# Patient Record
Sex: Female | Born: 2000 | Race: Black or African American | Hispanic: No | Marital: Single | Smoking: Never smoker
Health system: Southern US, Community
[De-identification: ages and names within clinical notes are randomized; demographics above are authoritative.]

## PROBLEM LIST (undated history)

## (undated) DIAGNOSIS — F419 Anxiety disorder, unspecified: Secondary | ICD-10-CM

## (undated) DIAGNOSIS — D649 Anemia, unspecified: Secondary | ICD-10-CM

## (undated) DIAGNOSIS — N200 Calculus of kidney: Secondary | ICD-10-CM

## (undated) DIAGNOSIS — R1115 Cyclical vomiting syndrome unrelated to migraine: Secondary | ICD-10-CM

## (undated) DIAGNOSIS — N83209 Unspecified ovarian cyst, unspecified side: Secondary | ICD-10-CM

## (undated) DIAGNOSIS — K219 Gastro-esophageal reflux disease without esophagitis: Secondary | ICD-10-CM

## (undated) DIAGNOSIS — F32A Depression, unspecified: Secondary | ICD-10-CM

## (undated) HISTORY — PX: OTHER SURGICAL HISTORY: SHX169

## (undated) HISTORY — DX: Cyclical vomiting syndrome unrelated to migraine: R11.15

## (undated) HISTORY — PX: TONSILLECTOMY: SUR1361

---
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
## 2021-07-05 ENCOUNTER — Emergency Department (HOSPITAL_COMMUNITY)
Admission: EM | Admit: 2021-07-05 | Discharge: 2021-07-05 | Disposition: A | Discharge status: Home / Self Care | Payer: Medicaid Other | Attending: Student | Admitting: Student

## 2021-07-05 ENCOUNTER — Other Ambulatory Visit: Payer: Self-pay

## 2021-07-05 DIAGNOSIS — Z5321 Procedure and treatment not carried out due to patient leaving prior to being seen by health care provider: Secondary | ICD-10-CM | POA: Diagnosis not present

## 2021-07-05 DIAGNOSIS — J029 Acute pharyngitis, unspecified: Secondary | ICD-10-CM | POA: Insufficient documentation

## 2021-07-05 DIAGNOSIS — J3489 Other specified disorders of nose and nasal sinuses: Secondary | ICD-10-CM | POA: Insufficient documentation

## 2021-07-05 NOTE — ED Triage Notes (Signed)
Pt here d/t sore throat and head congestion X9 days. Airway intact. Denies SOB, N,V

## 2021-09-28 ENCOUNTER — Ambulatory Visit (HOSPITAL_COMMUNITY)
Admission: EM | Admit: 2021-09-28 | Discharge: 2021-09-28 | Disposition: A | Discharge status: Home / Self Care | Payer: Medicaid Other | Attending: Family Medicine | Admitting: Family Medicine

## 2021-09-28 ENCOUNTER — Encounter (HOSPITAL_COMMUNITY): Payer: Self-pay

## 2021-09-28 ENCOUNTER — Other Ambulatory Visit: Payer: Self-pay

## 2021-09-28 DIAGNOSIS — J029 Acute pharyngitis, unspecified: Secondary | ICD-10-CM | POA: Diagnosis present

## 2021-09-28 LAB — RESPIRATORY PANEL BY PCR

## 2021-09-28 MED ORDER — LIDOCAINE VISCOUS HCL 2 % MT SOLN: 5.0000 mL | OROMUCOSAL | 0 refills | Status: AC | PRN

## 2021-09-28 NOTE — ED Triage Notes (Signed)
Pt c/o sore throat since Saturday. States had a covid exposure on Friday. States had a tonsillectomy 3wks ago.

## 2021-09-28 NOTE — ED Provider Notes (Signed)
[facility]    CSN: [id number] Arrival date & time: 09/28/21  1706      History   Chief Complaint Chief Complaint  Patient presents with   Sore Throat    HPI Linda Cross is a 20 y.o. female.   Presenting today with 4-day history of sore throat, congestion, mild cough.  Denies fever, chills, difficulty swallowing or breathing, abdominal pain, nausea vomiting or diarrhea.  So far has not tried anything over-the-counter for symptoms.  Multiple sick contacts at school with COVID per patient.  She is requesting respiratory testing.  She states she has status post tonsillectomy 3 weeks ago.  Has had an uncomplicated healing course thus far.   Past Medical History:  Diagnosis Date   Cyclical vomiting     Patient Active Problem List   Diagnosis Date Noted   Infectious colitis 03/05/2014   Diarrhea 02/16/2014   Dehydration 02/16/2014   Hx of Salmonella infection 02/16/2014   Cyclical vomiting     History reviewed. No pertinent surgical history.  OB History     Gravida  0   Para  0   Term  0   Preterm  0   AB  0   Living  0      SAB  0   IAB  0   Ectopic  0   Multiple  0   Live Births               Home Medications    Prior to Admission medications   Medication Sig Start Date End Date Taking? Authorizing Provider  lidocaine (XYLOCAINE) 2 % solution Use as directed 5 mLs in the mouth or throat as needed for mouth pain. 09/28/21  Yes Particia Nearing, PA-C  Multiple Vitamin (MULTIVITAMIN) tablet Take 1 tablet by mouth daily.    [provider]    Family History Family History  Problem Relation Age of Onset   Migraines Mother    Migraines Father    Crohn's disease Cousin     Social History Social History   Tobacco Use   Smoking status: Never   Smokeless tobacco: Never  Substance Use Topics   Alcohol use: No    Alcohol/week: 0.0 standard drinks   Drug use: No     Allergies   Patient has no known  allergies.   Review of Systems Review of Systems Per HPI  Physical Exam Triage Vital Signs ED Triage Vitals  Enc Vitals Group     BP 09/28/21 1825 133/64     Pulse Rate 09/28/21 1825 79     Resp 09/28/21 1825 18     Temp 09/28/21 1825 98.7 F (37.1 C)     Temp Source 09/28/21 1825 Oral     SpO2 09/28/21 1825 99 %     Weight --      Height --      Head Circumference --      Peak Flow --      Pain Score 09/28/21 1826 1     Pain Loc --      Pain Edu? --      Excl. in GC? --    No data found.  Updated Vital Signs BP 133/64 (BP Location: Left Arm)   Pulse 79   Temp 98.7 F (37.1 C) (Oral)   Resp 18   LMP 09/22/2021   SpO2 99%   Visual Acuity Right Eye Distance:   Left Eye Distance:   Bilateral Distance:  Right Eye Near:   Left Eye Near:    Bilateral Near:     Physical Exam Vitals and nursing note reviewed.  Constitutional:      Appearance: Normal appearance. She is not ill-appearing.  HENT:     Head: Atraumatic.     Nose: Rhinorrhea present.     Mouth/Throat:     Mouth: Mucous membranes are moist.     Pharynx: Oropharynx is clear. Posterior oropharyngeal erythema present. No oropharyngeal exudate.  Eyes:     Extraocular Movements: Extraocular movements intact.     Conjunctiva/sclera: Conjunctivae normal.  Cardiovascular:     Rate and Rhythm: Normal rate and regular rhythm.     Heart sounds: Normal heart sounds.  Pulmonary:     Effort: Pulmonary effort is normal.     Breath sounds: Normal breath sounds. No wheezing or rales.  Musculoskeletal:        General: Normal range of motion.     Cervical back: Normal range of motion and neck supple.  Skin:    General: Skin is warm and dry.     Findings: No erythema or rash.  Neurological:     Mental Status: She is alert and oriented to person, place, and time.     Motor: No weakness.     Gait: Gait normal.  Psychiatric:        Mood and Affect: Mood normal.        Thought Content: Thought content normal.         Judgment: Judgment normal.   UC Treatments / Results  Labs (all labs ordered are listed, but only abnormal results are displayed) Labs Reviewed  RESPIRATORY PANEL BY PCR    EKG   Radiology No results found.  Procedures Procedures (including critical care time)  Medications Ordered in UC Medications - No data to display  Initial Impression / Assessment and Plan / UC Course  I have reviewed the triage vital signs and the nursing notes.  Pertinent labs & imaging results that were available during my care of the patient were reviewed by me and considered in my medical decision making (see chart for details).     Vitals and exam reassuring, no evidence of acute distress.  COVID and respiratory panel pending, discussed lidocaine sent for symptomatic benefit.  Discussed over-the-counter decongestants, fever and pain reducers, quarantine protocol.  School note given.  Return for worsening symptoms.  Final Clinical Impressions(s) / UC Diagnoses   Final diagnoses:  Viral pharyngitis   Discharge Instructions   None    ED Prescriptions     Medication Sig Dispense Auth. Provider   lidocaine (XYLOCAINE) 2 % solution Use as directed 5 mLs in the mouth or throat as needed for mouth pain. 50 mL Volney American, [place]      PDMP not reviewed this encounter.   Volney American, [place] 09/28/21 1859

## 2021-09-28 NOTE — ED Notes (Signed)
No answer from lobby  

## 2022-03-27 ENCOUNTER — Other Ambulatory Visit: Payer: Self-pay

## 2022-03-27 ENCOUNTER — Encounter (HOSPITAL_BASED_OUTPATIENT_CLINIC_OR_DEPARTMENT_OTHER): Payer: Self-pay | Admitting: Emergency Medicine

## 2022-03-27 ENCOUNTER — Emergency Department (HOSPITAL_BASED_OUTPATIENT_CLINIC_OR_DEPARTMENT_OTHER)
Admission: EM | Admit: 2022-03-27 | Discharge: 2022-03-27 | Disposition: A | Discharge status: Home / Self Care | Payer: Medicaid Other | Attending: Emergency Medicine | Admitting: Emergency Medicine

## 2022-03-27 DIAGNOSIS — R0981 Nasal congestion: Secondary | ICD-10-CM | POA: Diagnosis not present

## 2022-03-27 DIAGNOSIS — J029 Acute pharyngitis, unspecified: Secondary | ICD-10-CM | POA: Diagnosis present

## 2022-03-27 DIAGNOSIS — H9209 Otalgia, unspecified ear: Secondary | ICD-10-CM | POA: Diagnosis not present

## 2022-03-27 DIAGNOSIS — J040 Acute laryngitis: Secondary | ICD-10-CM | POA: Insufficient documentation

## 2022-03-27 LAB — GROUP A STREP BY PCR: Group A Strep by PCR: NOT DETECTED

## 2022-03-27 NOTE — Discharge Instructions (Signed)
Please read and follow all provided instructions. ? ?Your diagnoses today include:  ?1. Laryngitis   ?2. Sore throat   ? ? ?Tests performed today include: ?Strep test: was negative for strep throat ?Vital signs. See below for your results today.  ? ?Medications prescribed:  ?None ? ?Home care instructions:  ?Please read the educational materials provided and follow any instructions contained in this packet. ? ?Follow-up instructions: ?Please follow-up with your primary care provider as needed for further evaluation of your symptoms. ? ?Return instructions:  ?Please return to the Emergency Department if you experience worsening symptoms.  ?Return if you have worsening problems swallowing, your neck becomes swollen, you cannot swallow your saliva or your voice becomes muffled.  ?Return with high persistent fever, persistent vomiting, or if you have trouble breathing.  ?Please return if you have any other emergent concerns. ? ?Additional Information: ? ?Your vital signs today were: ?BP (!) 130/92 (BP Location: Left Arm)   Pulse (!) 106   Temp 98.2 ?F (36.8 ?C) (Oral)   Resp (!) 22   Ht 5\' 5"  (1.651 m)   Wt 51.7 kg   LMP 03/11/2022   SpO2 100%   BMI 18.97 kg/m?  ?If your blood pressure (BP) was elevated above 135/85 this visit, please have this repeated by your doctor within one month. ?-------------- ? ?

## 2022-03-27 NOTE — ED Triage Notes (Signed)
Pt c/o sore throat onset Wednesday. ?

## 2022-03-27 NOTE — ED Provider Notes (Signed)
[facility] EMERGENCY DEPARTMENT Provider Note   CSN: [id number] Arrival date & time: 03/27/22  1214     History  Chief Complaint  Patient presents with   Sore Throat    Linda Cross is a 21 y.o. female.  Patient presents to the emergency department for evaluation of sore throat, hoarse voice, congestion, ear pain.  Symptoms started 2 to 3 days ago.  She works in a daycare.  No distinct sick contacts.  No fevers.  She is able to swallow without difficulty and no difficulty breathing.  No shortness of breath or wheezing.  She reports seasonal allergies.  No significant cough.  No vomiting or diarrhea.  She has taken over-the-counter sinus medication without much improvement.      Home Medications Prior to Admission medications   Medication Sig Start Date End Date Taking? Authorizing Provider  lidocaine (XYLOCAINE) 2 % solution Use as directed 5 mLs in the mouth or throat as needed for mouth pain. 09/28/21   Volney American, PA-C  Multiple Vitamin (MULTIVITAMIN) tablet Take 1 tablet by mouth daily.    [provider]      Allergies    Patient has no known allergies.    Review of Systems   Review of Systems  Physical Exam Updated Vital Signs BP (!) 130/92 (BP Location: Left Arm)   Pulse (!) 106   Temp 98.2 F (36.8 C) (Oral)   Resp (!) 22   Ht 5\' 5"  (1.651 m)   Wt 51.7 kg   LMP 03/11/2022   SpO2 100%   BMI 18.97 kg/m   Physical Exam Vitals and nursing note reviewed.  Constitutional:      Appearance: She is well-developed.  HENT:     Head: Normocephalic and atraumatic.     Jaw: No trismus.     Comments: Patient with scratchy voice    Right Ear: Tympanic membrane, ear canal and external ear normal.     Left Ear: Tympanic membrane, ear canal and external ear normal.     Nose: Nose normal. No mucosal edema or rhinorrhea.     Mouth/Throat:     Mouth: Mucous membranes are moist. Mucous membranes are not dry. No oral lesions.     Pharynx:  Uvula midline. No oropharyngeal exudate, posterior oropharyngeal erythema or uvula swelling.     Tonsils: No tonsillar abscesses.  Eyes:     General:        Right eye: No discharge.        Left eye: No discharge.     Conjunctiva/sclera: Conjunctivae normal.  Cardiovascular:     Rate and Rhythm: Normal rate and regular rhythm.     Heart sounds: Normal heart sounds.  Pulmonary:     Effort: Pulmonary effort is normal. No respiratory distress.     Breath sounds: Normal breath sounds. No wheezing or rales.  Abdominal:     Palpations: Abdomen is soft.     Tenderness: There is no abdominal tenderness.  Musculoskeletal:     Cervical back: Normal range of motion and neck supple.  Lymphadenopathy:     Cervical: No cervical adenopathy.  Skin:    General: Skin is warm and dry.  Neurological:     Mental Status: She is alert.  Psychiatric:        Mood and Affect: Mood normal.    ED Results / Procedures / Treatments   Labs (all labs ordered are listed, but only abnormal results are displayed) Labs Reviewed  GROUP  A STREP BY PCR    EKG None  Radiology No results found.  Procedures Procedures    Medications Ordered in ED Medications - No data to display  ED Course/ Medical Decision Making/ A&P    Patient seen and examined. History obtained directly from patient. Work-up including labs, imaging, EKG ordered in triage, if performed, were reviewed.    Labs/EKG: Independently reviewed and interpreted.  This included: Negative strep testing  Medications/Fluids: None ordered  Most recent vital signs reviewed and are as follows: BP (!) 130/92 (BP Location: Left Arm)   Pulse (!) 106   Temp 98.2 F (36.8 C) (Oral)   Resp (!) 22   Ht 5\' 5"  (1.651 m)   Wt 51.7 kg   LMP 03/11/2022   SpO2 100%   BMI 18.97 kg/m   Initial impression: Viral URI, laryngitis  Home treatment plan: Rest, hydration, OTC meds  Return instructions discussed with patient: Worsening difficulty  breathing or swallowing, fevers or other concerns  Follow-up instructions discussed with patient: Follow-up with PCP for recheck in the next 72 hours if not improving.                           Medical Decision Making  Patient with symptoms consistent with a viral syndrome, laryngitis/pharyngitis.  Considered strep pharyngitis, test negative.  Vitals are stable, no fever. No signs of dehydration. Lung exam normal, no signs of pneumonia. Supportive therapy indicated with return if symptoms worsen.           Final Clinical Impression(s) / ED Diagnoses Final diagnoses:  Laryngitis  Sore throat    Rx / DC Orders ED Discharge Orders     None         Carlisle Cater, PA-C 03/27/22 1500    Luna Fuse, MD 04/15/22 2156

## 2022-09-07 LAB — HEPATITIS C ANTIBODY: HCV Ab: NEGATIVE

## 2022-09-07 LAB — OB RESULTS CONSOLE HEPATITIS B SURFACE ANTIGEN: Hepatitis B Surface Ag: NEGATIVE

## 2022-09-28 ENCOUNTER — Inpatient Hospital Stay (HOSPITAL_COMMUNITY): Payer: Medicaid Other

## 2022-09-28 ENCOUNTER — Encounter (HOSPITAL_COMMUNITY): Payer: Self-pay | Admitting: *Deleted

## 2022-09-28 ENCOUNTER — Inpatient Hospital Stay (HOSPITAL_COMMUNITY)
Admission: AD | Admit: 2022-09-28 | Discharge: 2022-09-28 | Disposition: A | Discharge status: Home / Self Care | Payer: Medicaid Other | Attending: Obstetrics and Gynecology | Admitting: Obstetrics and Gynecology

## 2022-09-28 DIAGNOSIS — O99891 Other specified diseases and conditions complicating pregnancy: Secondary | ICD-10-CM | POA: Insufficient documentation

## 2022-09-28 DIAGNOSIS — M546 Pain in thoracic spine: Secondary | ICD-10-CM | POA: Diagnosis not present

## 2022-09-28 DIAGNOSIS — K219 Gastro-esophageal reflux disease without esophagitis: Secondary | ICD-10-CM | POA: Diagnosis not present

## 2022-09-28 DIAGNOSIS — O26891 Other specified pregnancy related conditions, first trimester: Secondary | ICD-10-CM | POA: Insufficient documentation

## 2022-09-28 DIAGNOSIS — K5900 Constipation, unspecified: Secondary | ICD-10-CM | POA: Diagnosis not present

## 2022-09-28 DIAGNOSIS — R102 Pelvic and perineal pain: Secondary | ICD-10-CM | POA: Insufficient documentation

## 2022-09-28 DIAGNOSIS — R109 Unspecified abdominal pain: Secondary | ICD-10-CM | POA: Diagnosis not present

## 2022-09-28 DIAGNOSIS — Z3A01 Less than 8 weeks gestation of pregnancy: Secondary | ICD-10-CM | POA: Diagnosis not present

## 2022-09-28 DIAGNOSIS — Z79899 Other long term (current) drug therapy: Secondary | ICD-10-CM | POA: Diagnosis not present

## 2022-09-28 DIAGNOSIS — Z87442 Personal history of urinary calculi: Secondary | ICD-10-CM | POA: Insufficient documentation

## 2022-09-28 DIAGNOSIS — O99619 Diseases of the digestive system complicating pregnancy, unspecified trimester: Secondary | ICD-10-CM | POA: Insufficient documentation

## 2022-09-28 HISTORY — DX: Calculus of kidney: N20.0

## 2022-09-28 HISTORY — DX: Depression, unspecified: F32.A

## 2022-09-28 HISTORY — DX: Unspecified ovarian cyst, unspecified side: N83.209

## 2022-09-28 HISTORY — DX: Gastro-esophageal reflux disease without esophagitis: K21.9

## 2022-09-28 HISTORY — DX: Anxiety disorder, unspecified: F41.9

## 2022-09-28 HISTORY — DX: Anemia, unspecified: D64.9

## 2022-09-28 LAB — CBC WITH DIFFERENTIAL/PLATELET
Abs Immature Granulocytes: 0 10*3/uL (ref 0.00–0.07)
Basophils Absolute: 0 10*3/uL (ref 0.0–0.1)
Basophils Relative: 1 %
Eosinophils Absolute: 0 10*3/uL (ref 0.0–0.5)
Eosinophils Relative: 1 %
HCT: 40.2 % (ref 36.0–46.0)
Hemoglobin: 12.4 g/dL (ref 12.0–15.0)
Immature Granulocytes: 0 %
Lymphocytes Relative: 44 %
Lymphs Abs: 2 10*3/uL (ref 0.7–4.0)
MCH: 23.9 pg — ABNORMAL LOW (ref 26.0–34.0)
MCHC: 30.8 g/dL (ref 30.0–36.0)
MCV: 77.5 fL — ABNORMAL LOW (ref 80.0–100.0)
Monocytes Absolute: 0.5 10*3/uL (ref 0.1–1.0)
Monocytes Relative: 10 %
Neutro Abs: 2.1 10*3/uL (ref 1.7–7.7)
Neutrophils Relative %: 44 %
Platelets: 269 10*3/uL (ref 150–400)
RBC: 5.19 MIL/uL — ABNORMAL HIGH (ref 3.87–5.11)
RDW: 15.9 % — ABNORMAL HIGH (ref 11.5–15.5)
WBC: 4.7 10*3/uL (ref 4.0–10.5)
nRBC: 0 % (ref 0.0–0.2)

## 2022-09-28 LAB — ABO/RH: ABO/RH(D): O POS

## 2022-09-28 LAB — COMPREHENSIVE METABOLIC PANEL
ALT: 13 U/L (ref 0–44)
AST: 18 U/L (ref 15–41)
Albumin: 3.9 g/dL (ref 3.5–5.0)
Alkaline Phosphatase: 47 U/L (ref 38–126)
Anion gap: 6 (ref 5–15)
BUN: <5 mg/dL — ABNORMAL LOW (ref 6–20)
CO2: 23 mmol/L (ref 22–32)
Calcium: 9.4 mg/dL (ref 8.9–10.3)
Chloride: 107 mmol/L (ref 98–111)
Creatinine, Ser: 0.6 mg/dL (ref 0.44–1.00)
GFR, Estimated: >60 mL/min (ref >60)
Glucose, Bld: 91 mg/dL (ref 70–99)
Potassium: 4.3 mmol/L (ref 3.5–5.1)
Sodium: 136 mmol/L (ref 135–145)
Total Bilirubin: 0.5 mg/dL (ref 0.3–1.2)
Total Protein: 7.4 g/dL (ref 6.5–8.1)

## 2022-09-28 LAB — URINALYSIS, ROUTINE W REFLEX MICROSCOPIC
Bilirubin Urine: NEGATIVE
Glucose, UA: NEGATIVE mg/dL
Hgb urine dipstick: NEGATIVE
Ketones, ur: NEGATIVE mg/dL
Leukocytes,Ua: NEGATIVE
Nitrite: NEGATIVE
Protein, ur: NEGATIVE mg/dL
Specific Gravity, Urine: 1.002 — ABNORMAL LOW (ref 1.005–1.030)
pH: 7 (ref 5.0–8.0)

## 2022-09-28 LAB — HCG, QUANTITATIVE, PREGNANCY: hCG, Beta Chain, Quant, S: 7371 m[IU]/mL — ABNORMAL HIGH (ref <5)

## 2022-09-28 MED ORDER — PRENATAL VITAMIN 27-0.8 MG PO TABS: 1.0000 | ORAL_TABLET | Freq: Every day | ORAL | 2 refills | Status: AC

## 2022-09-28 NOTE — MAU Provider Note (Addendum)
History     CSN: [id number]  Arrival date and time: 09/28/22 0851   Event Date/Time   First Provider Initiated Contact with Patient 09/28/22 1004      Chief Complaint  Patient presents with   Pelvic Pain   Back Pain   Chest Pain   Linda Cross is a 21 yo G2P0010 w/Hx of scoliosis, anemia, GERD, cyclical vomiting, anxiety and depression, kidney stones, and ovarian cyst who present w/ CC of pelvic cramping since Thursday or Friday, and chest/back pain since Sunday. Pelvic pain is crampy, on/off, and when it happens it "goes to her (L) butt and down her leg." She says she has scoliosis and normally her low back hurts, but since Sunday she's had chest pain that "goes around everywhere" and back pain in her upper back that feels like tightness. Also have some SOB when she gets up to do anything. After she is SOB, she will start gagging, then she'll start coughing, but denies cough, runny nose, or sore throat otherwise. She denies bleeding, leaking any fluid or discharge. She has been gagging, but denies nausea or vomiting. Has GERD and takes omeprazole daily for this. Also takes Zyrtec for seasonal allergies. No lightheadedness. Trouble seeing, but this resolves when she wears her glasses. This is not her first pregnancy, and does not feel like her first, which was miscarried. Has headaches that come and go. Does feel constipated and has the urge to go to the bathroom a lot, with nothing coming out. She did say she had a larger volume formed bowel movement yesterday. Has been gassy as well with pregnancy. Burping a lot.   Did have testing for STIs after new partner on 10/11 ([facility]). Was positive for Candida, negative for GC/CT/Trich. She was treated for her yeast infection.  Pelvic Pain The patient's primary symptoms include pelvic pain. Associated symptoms include back pain.  Back Pain Associated symptoms include chest pain and pelvic pain.  Chest Pain  Associated symptoms include  back pain.    OB History     Gravida  2   Para  0   Term  0   Preterm  0   AB  1   Living  0      SAB  0   IAB  0   Ectopic  0   Multiple  0   Live Births              Past Medical History:  Diagnosis Date   Anemia    Anxiety    Cyclical vomiting    Depression    GERD (gastroesophageal reflux disease)    Kidney stones    Ovarian cyst     Past Surgical History:  Procedure Laterality Date   arm surgery Left    TONSILLECTOMY      Family History  Problem Relation Age of Onset   Heart disease Mother        rt carotid artery is blocked   Depression Mother    Anxiety disorder Mother    Migraines Mother    Fibromyalgia Mother    Hydrocephalus Mother        has shunt   Depression Father    Anxiety disorder Father    Migraines Father    Crohn's disease Cousin     Social History   Tobacco Use   Smoking status: Never   Smokeless tobacco: Never  Vaping Use   Vaping Use: Never used  Substance Use Topics   Alcohol  use: No    Alcohol/week: 0.0 standard drinks of alcohol   Drug use: No    Allergies: No Known Allergies  Medications Prior to Admission  Medication Sig Dispense Refill Last Dose   Multiple Vitamin (MULTIVITAMIN) tablet Take 1 tablet by mouth daily.   09/28/2022   omeprazole (PRILOSEC) 40 MG capsule Take 40 mg by mouth daily.   09/28/2022   lidocaine (XYLOCAINE) 2 % solution Use as directed 5 mLs in the mouth or throat as needed for mouth pain. 50 mL 0     Review of Systems  Cardiovascular:  Positive for chest pain.  Genitourinary:  Positive for pelvic pain.  Musculoskeletal:  Positive for back pain.   Physical Exam   Blood pressure 123/70, pulse 67, temperature 98 F (36.7 C), temperature source Oral, resp. rate 18, height 5\' 5"  (1.651 m), weight 50 kg, last menstrual period 08/20/2022, SpO2 100 %.  Physical Exam Vitals and nursing note reviewed.  Constitutional:      General: She is not in acute distress.    Appearance:  She is well-developed and normal weight. She is not ill-appearing, toxic-appearing or diaphoretic.  HENT:     Head: Normocephalic and atraumatic.  Eyes:     Extraocular Movements: Extraocular movements intact.     Pupils: Pupils are equal, round, and reactive to light.  Cardiovascular:     Rate and Rhythm: Normal rate and regular rhythm.     Heart sounds: Normal heart sounds.  Pulmonary:     Effort: Pulmonary effort is normal. No tachypnea, accessory muscle usage or respiratory distress.     Breath sounds: Normal breath sounds. No stridor.  Abdominal:     General: Bowel sounds are normal.     Palpations: Abdomen is soft. There is no hepatomegaly, splenomegaly or mass.     Tenderness: There is abdominal tenderness. There is guarding. There is no rebound.     Comments: Tender over epigastric region  Musculoskeletal:     Right lower leg: No tenderness.     Left lower leg: No tenderness.  Skin:    General: Skin is warm and dry.     Capillary Refill: Capillary refill takes less than 2 seconds.  Neurological:     General: No focal deficit present.     Mental Status: She is alert.  Psychiatric:        Mood and Affect: Mood normal.        Behavior: Behavior normal.     MAU Course  Procedures  MDM Ddx included, but were not limited to, ectopic pregnancy, UTI, STI, other gyn infection, kidney stones, ovarian torsion, early pregnancy loss, normal pregnancy, anxiety, constipation, musculoskeletal pain, chronic reflux, PE, MI, perforated ulcer. Patient overall appears well. Her vital signs are normal. Physical exam significant for epigastric pain.  On checking back in, Pt reported her Sx had resolved. Given normal labs from 09/07/22, low suspicion for infection, as she has not had a new partner and was treated for Candidiasis. HCG quant nml for EGA. TVUS confirmed IUP at 5 wks. CBC/CMP nml except for microcytosis. UA wnml. Her blood type is O+. EKG w/sinus rhythm. Sx likely due to chronic  reflux, given her Hx and physical exam. Back pain could be referred vs musculoskeletal. Cramping likely a result of gas and constipation. Given how well she looks, benign physical exam, and nml labs, I do not suspect any life-threatening causes of CP/SOB or gynecological Sx. She should continue routine prenatal care.    Assessment  and Plan   Intrauterine pregnancy - Go to prenatal visits as scheduled - Prenatal vitamin  Constipation - Miralax - High fiber diet  GERD - Continue omeprazole - Avoid trigger foods  Wilhemina Cash 09/28/2022, 11:38 AM    Dr. Tamala Julian with Cardiology reviewed EKG.; No acute findings.    Lezlie Lye, NP 09/28/2022 12:13 PM   Attestation of Supervision of Resident:  I confirm that I have verified the information documented in the  residents note and that I have also personally reperformed the history, physical exam and all medical decision making activities.  I have verified that all services and findings are accurately documented in this student's note; and I agree with management and plan as outlined in the documentation. I have also made any necessary editorial changes.  (add any additional history, PE or MDM you re-performed)  Noni Saupe, NP Center for [facility], [place] 09/28/2022 12:13 PM

## 2022-09-28 NOTE — MAU Note (Addendum)
Linda Cross is a 21 y.o. at [redacted]w[redacted]d here in MAU reporting: having pain in lower abd, happens periodically. Cramping into her buttocks. Feels like menstrual cramps. Happens when she she is holding pee or poop or when she has gas.  Doesn't know if this is causing her chest pain or not. Pain in her chest 'moves'.  Also having pain in upper back, happens at same time as chest pain. The chest pain occurs before or after the abd cramping, also when she is hungry or experiencing heartburn. LMP: 9/23 Onset of complaint: abd/pelvic- started last week, chest pain- Sun Pain score: 9/ ch 7, back 9 Vitals:   09/28/22 0911  BP: 117/67  Pulse: 78  Resp: 18  Temp: 98 F (36.7 C)  SpO2: 100%      Lab orders placed from triage:  ua  Did not call OB

## 2022-11-09 ENCOUNTER — Inpatient Hospital Stay (HOSPITAL_COMMUNITY)
Admission: AD | Admit: 2022-11-09 | Discharge: 2022-11-10 | Disposition: A | Discharge status: Home / Self Care | Payer: Medicaid Other | Attending: Obstetrics and Gynecology | Admitting: Obstetrics and Gynecology

## 2022-11-09 ENCOUNTER — Encounter (HOSPITAL_COMMUNITY): Payer: Self-pay | Admitting: Obstetrics and Gynecology

## 2022-11-09 ENCOUNTER — Inpatient Hospital Stay (HOSPITAL_COMMUNITY): Payer: Medicaid Other

## 2022-11-09 DIAGNOSIS — Z3A11 11 weeks gestation of pregnancy: Secondary | ICD-10-CM | POA: Insufficient documentation

## 2022-11-09 DIAGNOSIS — N760 Acute vaginitis: Secondary | ICD-10-CM | POA: Diagnosis not present

## 2022-11-09 DIAGNOSIS — O418X11 Other specified disorders of amniotic fluid and membranes, first trimester, fetus 1: Secondary | ICD-10-CM

## 2022-11-09 DIAGNOSIS — O219 Vomiting of pregnancy, unspecified: Secondary | ICD-10-CM | POA: Diagnosis not present

## 2022-11-09 DIAGNOSIS — K5900 Constipation, unspecified: Secondary | ICD-10-CM | POA: Diagnosis not present

## 2022-11-09 DIAGNOSIS — B9689 Other specified bacterial agents as the cause of diseases classified elsewhere: Secondary | ICD-10-CM

## 2022-11-09 DIAGNOSIS — O208 Other hemorrhage in early pregnancy: Secondary | ICD-10-CM | POA: Diagnosis not present

## 2022-11-09 DIAGNOSIS — O99611 Diseases of the digestive system complicating pregnancy, first trimester: Secondary | ICD-10-CM | POA: Diagnosis not present

## 2022-11-09 DIAGNOSIS — O26891 Other specified pregnancy related conditions, first trimester: Secondary | ICD-10-CM | POA: Diagnosis present

## 2022-11-09 DIAGNOSIS — O209 Hemorrhage in early pregnancy, unspecified: Secondary | ICD-10-CM

## 2022-11-09 LAB — URINALYSIS, ROUTINE W REFLEX MICROSCOPIC
Bacteria, UA: NONE SEEN
Bilirubin Urine: NEGATIVE
Glucose, UA: NEGATIVE mg/dL
Ketones, ur: 80 mg/dL — AB
Leukocytes,Ua: NEGATIVE
Nitrite: NEGATIVE
Protein, ur: 30 mg/dL — AB
RBC / HPF: >50 RBC/hpf — ABNORMAL HIGH (ref 0–5)
Specific Gravity, Urine: 1.012 (ref 1.005–1.030)
pH: 6 (ref 5.0–8.0)

## 2022-11-09 MED ORDER — ONDANSETRON HCL 4 MG/2ML IJ SOLN
4.0000 mg | Freq: Once | INTRAMUSCULAR | Status: AC
  Administered 2022-11-09: 4 mg via INTRAVENOUS
  Filled 2022-11-09: qty 2

## 2022-11-09 MED ORDER — LACTATED RINGERS IV BOLUS
1000.0000 mL | Freq: Once | INTRAVENOUS | Status: AC
  Administered 2022-11-09: 1000 mL via INTRAVENOUS

## 2022-11-09 NOTE — MAU Provider Note (Signed)
History     [id number]  Arrival date and time: 11/09/22 2112    Chief Complaint  Patient presents with   Nausea   Emesis    HPI Linda Cross is a 21 y.o. at [redacted]w[redacted]d by LMP who presents with nausea, vomiting,, feeling dehydrated. She has also been consitpated over the last couple weeks and started to notice some vaginal bleeding this evening.  No abdominal pain or camping. She had tried some zofran, belonging to her mom, but did not seem to help the nausea. Hasn't been drinking any fluids. 1 episode of vomiting today. Last BM was yesterday, small amount, and prior to that was about 3 days before. LOF: Yes Fetal Movement: not applicable, given GA Contractions: No  --/--/O POS (11/01 0941)  OB History     Gravida  2   Para  0   Term  0   Preterm  0   AB  1   Living  0      SAB  0   IAB  0   Ectopic  0   Multiple  0   Live Births              Past Medical History:  Diagnosis Date   Anemia    Anxiety    Cyclical vomiting    Depression    GERD (gastroesophageal reflux disease)    Kidney stones    Ovarian cyst     Past Surgical History:  Procedure Laterality Date   arm surgery Left    TONSILLECTOMY      Family History  Problem Relation Age of Onset   Heart disease Mother        rt carotid artery is blocked   Depression Mother    Anxiety disorder Mother    Migraines Mother    Fibromyalgia Mother    Hydrocephalus Mother        has shunt   Depression Father    Anxiety disorder Father    Migraines Father    Crohn's disease Cousin     Social History   Socioeconomic History   Marital status: Single    Spouse name: Not on file   Number of children: Not on file   Years of education: Not on file   Highest education level: Not on file  Occupational History   Not on file  Tobacco Use   Smoking status: Never   Smokeless tobacco: Never  Vaping Use   Vaping Use: Never used  Substance and Sexual Activity   Alcohol use: No     Alcohol/week: 0.0 standard drinks of alcohol   Drug use: No   Sexual activity: Not Currently  Other Topics Concern   Not on file  Social History Narrative   In 6th grade, doing very well in school. Lives with mom and brother. No smoke exposure.   Social Determinants of Health   Financial Resource Strain: Not on file  Food Insecurity: Not on file  Transportation Needs: Not on file  Physical Activity: Not on file  Stress: Not on file  Social Connections: Not on file  Intimate Partner Violence: Not on file    No Known Allergies  No current facility-administered medications on file prior to encounter.   Current Outpatient Medications on File Prior to Encounter  Medication Sig Dispense Refill   omeprazole (PRILOSEC) 40 MG capsule Take 40 mg by mouth daily.     Prenatal Vit-Fe Fumarate-FA (PRENATAL VITAMIN) 27-0.8 MG TABS Take 1 tablet by mouth  daily. 90 tablet 2   lidocaine (XYLOCAINE) 2 % solution Use as directed 5 mLs in the mouth or throat as needed for mouth pain. 50 mL 0     Review of Systems  Constitutional:  Negative for chills and fever.  Eyes:  Negative for blurred vision.  Cardiovascular:  Negative for leg swelling.  Gastrointestinal:  Positive for constipation, nausea and vomiting. Negative for abdominal pain.  Genitourinary:  Negative for dysuria, frequency and urgency.  Musculoskeletal:  Negative for myalgias.  Skin:  Negative for itching.  Neurological:  Negative for dizziness.   Pertinent positives and negative per HPI, all others reviewed and negative  Physical Exam   BP (!) 118/59   Pulse 100   Temp 98 F (36.7 C) (Oral)   Resp 18   Ht 5\' 5"  (1.651 m)   Wt 50.8 kg   LMP 08/20/2022   SpO2 100%   BMI 18.62 kg/m   No data found.  FHT: 169bpm, via doppler  Labs No results found for this or any previous visit (from the past 24 hour(s)).   Imaging No results found.  MAU Course  Procedures Lab Orders         Wet prep, genital          Urinalysis, Routine w reflex microscopic    Meds ordered this encounter  Medications   lactated ringers bolus 1,000 mL   ondansetron (ZOFRAN) injection 4 mg   ondansetron (ZOFRAN) 4 MG tablet    Sig: Take 1 tablet (4 mg total) by mouth every 8 (eight) hours as needed for nausea or vomiting.    Dispense:  30 tablet    Refill:  2   metroNIDAZOLE (FLAGYL) 500 MG tablet    Sig: Take 1 tablet (500 mg total) by mouth 2 (two) times daily for 7 days.    Dispense:  14 tablet    Refill:  0    08/22/2022 OB Comp Less 14 Wks  Result Date: 11/10/2022 CLINICAL DATA:  Pregnant patient with vaginal bleeding. EXAM: OBSTETRIC <14 WK ULTRASOUND TECHNIQUE: Transabdominal ultrasound was performed for evaluation of the gestation as well as the maternal uterus and adnexal regions. COMPARISON:  Obstetric ultrasound 09/28/2022 FINDINGS: Intrauterine gestational sac: Single Yolk sac:  Not Visualized, likely normal for gestational age. Embryo:  Visualized. Cardiac Activity: Visualized. Heart Rate: 167 bpm CRL:   55.8 mm   12 w 1 d                  13/11/2021 EDC: 05/23/2023 Subchorionic hemorrhage:  Moderate measuring 8.5 x 1.0 x 5.3 cm. Maternal uterus/adnexae: Both ovaries are normal in size. No adnexal mass. No pelvic free fluid. IMPRESSION: 1. Single live intrauterine pregnancy estimated gestational age [redacted] weeks 1 day based on crown-rump length for ultrasound [facility] 05/23/2023. 2. Moderate size subchorionic hemorrhage. Electronically Signed   By: 05/25/2023 M.D.   On: 11/10/2022 00:39     MDM moderate  Assessment and Plan  21 y.o. G2P0010 at [redacted]w[redacted]d with vaginal bleeding, nausea, vomiting and dehyration.  Bleeding likely 2/2 to subchorionic hemorrhage. Discussed findings with patient, and increased risk for spontaneous abortion, preterm labor, placenta abruption.  Bleeding seems to have slowed down Return precautions discussed.  IVF given, and IV antiemetics for nausea with good improvement.  Soap sud enema performed,  with a BM following.  Bacteria vaginosis - rx for metronidazole sent.  Patient feels a lot of relief and is discharged home in stable condition.  FWB: normal  HR per doppler.   Dispo: discharged to home in stable condition.  Allergies as of 11/10/2022   No Known Allergies      Medication List     TAKE these medications    lidocaine 2 % solution Commonly known as: XYLOCAINE Use as directed 5 mLs in the mouth or throat as needed for mouth pain.   metroNIDAZOLE 500 MG tablet Commonly known as: Flagyl Take 1 tablet (500 mg total) by mouth 2 (two) times daily for 7 days.   omeprazole 40 MG capsule Commonly known as: PRILOSEC Take 40 mg by mouth daily.   ondansetron 4 MG tablet Commonly known as: Zofran Take 1 tablet (4 mg total) by mouth every 8 (eight) hours as needed for nausea or vomiting.   Prenatal Vitamin 27-0.8 MG Tabs Take 1 tablet by mouth daily.       Sheppard Evens MD MPH OB Fellow, Faculty Practice [facility], Center for [facility] 11/11/2022

## 2022-11-09 NOTE — MAU Note (Signed)
.  Linda Cross is a 21 y.o. at [redacted]w[redacted]d here in MAU reporting: N/V ongoing, last epispide of emesis once last night 12/12 and then a week previous one episode. Pt reports she had breakfast and some strawberries and grapes today. Pt report constipation. Pt taking omeprazole. Pt has been drinking but continued nauseous. Pt reports VB that started 2 hours ago with watery discharge and red, currently wearing a pad. Pt reports change her pad once that was saturated.  Pt denies pain.  Onset of complaint: 2100 Pain score: 01/10 Vitals:   11/09/22 2302  BP: 123/82  Pulse: 97  Resp: 18  Temp: 98 F (36.7 C)  SpO2: 100%     FHT:169 Lab orders placed from triage:  ua

## 2022-11-10 DIAGNOSIS — O219 Vomiting of pregnancy, unspecified: Secondary | ICD-10-CM

## 2022-11-10 DIAGNOSIS — O99611 Diseases of the digestive system complicating pregnancy, first trimester: Secondary | ICD-10-CM

## 2022-11-10 DIAGNOSIS — K5900 Constipation, unspecified: Secondary | ICD-10-CM

## 2022-11-10 DIAGNOSIS — O418X11 Other specified disorders of amniotic fluid and membranes, first trimester, fetus 1: Secondary | ICD-10-CM

## 2022-11-10 DIAGNOSIS — O468X1 Other antepartum hemorrhage, first trimester: Secondary | ICD-10-CM

## 2022-11-10 DIAGNOSIS — O209 Hemorrhage in early pregnancy, unspecified: Secondary | ICD-10-CM

## 2022-11-10 DIAGNOSIS — B9689 Other specified bacterial agents as the cause of diseases classified elsewhere: Secondary | ICD-10-CM

## 2022-11-10 DIAGNOSIS — N760 Acute vaginitis: Secondary | ICD-10-CM

## 2022-11-10 DIAGNOSIS — Z3A11 11 weeks gestation of pregnancy: Secondary | ICD-10-CM

## 2022-11-10 LAB — GC/CHLAMYDIA PROBE AMP (~~LOC~~) NOT AT ARMC
Chlamydia: NEGATIVE
Comment: NEGATIVE
Comment: NORMAL
Neisseria Gonorrhea: NEGATIVE

## 2022-11-10 LAB — WET PREP, GENITAL
Sperm: NONE SEEN
Trich, Wet Prep: NONE SEEN
WBC, Wet Prep HPF POC: <10 (ref <10)
Yeast Wet Prep HPF POC: NONE SEEN

## 2022-11-10 MED ORDER — ONDANSETRON HCL 4 MG PO TABS: 4.0000 mg | ORAL_TABLET | Freq: Three times a day (TID) | ORAL | 2 refills | Status: AC | PRN

## 2022-11-10 MED ORDER — METRONIDAZOLE 500 MG PO TABS: 500.0000 mg | ORAL_TABLET | Freq: Two times a day (BID) | ORAL | 0 refills | Status: AC

## 2022-11-10 NOTE — Discharge Instructions (Signed)
Try to take 1 capful of miralax in 8oz of water or orange juice daily. Increase by 1/2 cap daily if no bowel movement until you consistently have 1 soft stool a day  Keep well hydrated. Eat lots of fibre and keep physically active.

## 2022-11-19 ENCOUNTER — Encounter (HOSPITAL_COMMUNITY): Payer: Self-pay | Admitting: Obstetrics and Gynecology

## 2022-11-19 ENCOUNTER — Inpatient Hospital Stay (HOSPITAL_COMMUNITY)
Admission: AD | Admit: 2022-11-19 | Discharge: 2022-11-19 | Disposition: A | Discharge status: Home / Self Care | Payer: Medicaid Other | Attending: Obstetrics and Gynecology | Admitting: Obstetrics and Gynecology

## 2022-11-19 ENCOUNTER — Other Ambulatory Visit: Payer: Self-pay

## 2022-11-19 DIAGNOSIS — O99612 Diseases of the digestive system complicating pregnancy, second trimester: Secondary | ICD-10-CM

## 2022-11-19 DIAGNOSIS — R109 Unspecified abdominal pain: Secondary | ICD-10-CM | POA: Insufficient documentation

## 2022-11-19 DIAGNOSIS — O99611 Diseases of the digestive system complicating pregnancy, first trimester: Secondary | ICD-10-CM | POA: Insufficient documentation

## 2022-11-19 DIAGNOSIS — Z3A13 13 weeks gestation of pregnancy: Secondary | ICD-10-CM | POA: Diagnosis not present

## 2022-11-19 DIAGNOSIS — O26891 Other specified pregnancy related conditions, first trimester: Secondary | ICD-10-CM | POA: Diagnosis present

## 2022-11-19 DIAGNOSIS — K5900 Constipation, unspecified: Secondary | ICD-10-CM

## 2022-11-19 LAB — URINALYSIS, ROUTINE W REFLEX MICROSCOPIC
Bilirubin Urine: NEGATIVE
Glucose, UA: NEGATIVE mg/dL
Hgb urine dipstick: NEGATIVE
Ketones, ur: NEGATIVE mg/dL
Leukocytes,Ua: NEGATIVE
Nitrite: NEGATIVE
Protein, ur: NEGATIVE mg/dL
Specific Gravity, Urine: 1.013 (ref 1.005–1.030)
pH: 8 (ref 5.0–8.0)

## 2022-11-19 MED ORDER — GLYCOPYRROLATE 1 MG PO TABS: 1.0000 mg | ORAL_TABLET | Freq: Three times a day (TID) | ORAL | 0 refills | Status: DC

## 2022-11-19 MED ORDER — POLYETHYLENE GLYCOL 3350 17 G PO PACK: 17.0000 g | PACK | Freq: Every day | ORAL | 0 refills | Status: AC

## 2022-11-19 MED ORDER — PROMETHAZINE HCL 25 MG PO TABS: 25.0000 mg | ORAL_TABLET | Freq: Four times a day (QID) | ORAL | 0 refills | Status: DC | PRN

## 2022-11-19 MED ORDER — DOCUSATE SODIUM 250 MG PO CAPS: 250.0000 mg | ORAL_CAPSULE | Freq: Every day | ORAL | 0 refills | Status: AC

## 2022-11-19 MED ORDER — SORBITOL 70 % SOLN
960.0000 mL | TOPICAL_OIL | Freq: Once | ORAL | Status: AC
  Administered 2022-11-19: 960 mL via RECTAL
  Filled 2022-11-19: qty 240

## 2022-11-19 MED ORDER — PROMETHAZINE HCL 25 MG PO TABS
25.0000 mg | ORAL_TABLET | Freq: Once | ORAL | Status: AC
  Administered 2022-11-19: 25 mg via ORAL
  Filled 2022-11-19: qty 1

## 2022-11-19 NOTE — MAU Note (Signed)
Linda Cross is a 21 y.o. at [redacted]w[redacted]d here in MAU reporting: EMS arrival complaining of abdominal pain that has worsened over the last 2 hours. States she has been constipated for 3 days. When she had a BM last it was streaked with blood. Denies any vaginal bleeding. States she has had N/V states she has thrown up once in 24 hours. States she started abx for BV on 11/11/22.   Onset of complaint: 3 days ago  Pain score: 4 Vitals:   11/19/22 1529  BP: 120/76  Pulse: 93  Resp: 17  Temp: 97.6 F (36.4 C)     FHT:164

## 2022-11-19 NOTE — Discharge Instructions (Addendum)
Safe Medications in Pregnancy  ? ? ?Acne: ?Benzoyl Peroxide ?Salicylic Acid ? ?Backache/Headache: ?Tylenol: 2 regular strength every 4 hours OR ?             2 Extra strength every 6 hours ? ?Colds/Coughs/Allergies: ?Benadryl (alcohol free) 25 mg every 6 hours as needed ?Breath right strips ?Claritin ?Cepacol throat lozenges ?Chloraseptic throat spray ?Cold-Eeze- up to three times per day ?Cough drops, alcohol free ?Flonase (by prescription only) ?Guaifenesin ?Mucinex ?Robitussin DM (plain only, alcohol free) ?Saline nasal spray/drops ?Sudafed (pseudoephedrine) & Actifed ** use only after [redacted] weeks gestation and if you do not have high blood pressure ?Tylenol ?Vicks Vaporub ?Zinc lozenges ?Zyrtec  ? ?Constipation: ?Colace ?Ducolax suppositories ?Fleet enema ?Glycerin suppositories ?Metamucil ?Milk of magnesia ?Miralax ?Senokot ?Smooth move tea ? ?Diarrhea: ?Kaopectate ?Imodium A-D ? ?*NO pepto Bismol ? ?Hemorrhoids: ?Anusol ?Anusol HC ?Preparation H ?Tucks ? ?Indigestion: ?Tums ?Maalox ?Mylanta ?Zantac  ?Pepcid ? ?Insomnia: ?Benadryl (alcohol free) 25mg every 6 hours as needed ?Tylenol PM ?Unisom, no Gelcaps ? ?Leg Cramps: ?Tums ?MagGel ? ?Nausea/Vomiting:  ?Bonine ?Dramamine ?Emetrol ?Ginger extract ?Sea bands ?Meclizine  ?Nausea medication to take during pregnancy:  ?Unisom (doxylamine succinate 25 mg tablets) Take one tablet daily at bedtime. If symptoms are not adequately controlled, the dose can be increased to a maximum recommended dose of two tablets daily (1/2 tablet in the morning, 1/2 tablet mid-afternoon and one at bedtime). ?Vitamin B6 100mg tablets. Take one tablet twice a day (up to 200 mg per day). ? ?Skin Rashes: ?Aveeno products ?Benadryl cream or 25mg every 6 hours as needed ?Calamine Lotion ?1% cortisone cream ? ?Yeast infection: ?Gyne-lotrimin 7 ?Monistat 7 ? ? ?**If taking multiple medications, please check labels to avoid duplicating the same active ingredients ?**take  medication as directed on the label ?** Do not exceed 4000 mg of tylenol in 24 hours ?**Do not take medications that contain aspirin or ibuprofen ? ? ? ?You have constipation which is hard stools that are difficult to pass. It is important to have regular bowel movements every 1-3 days that are soft and easy to pass. Hard stools increase your risk of hemorrhoids and are very uncomfortable.  ? ?To prevent constipation you can increase the amount of fiber in your diet. Examples of foods with fiber are leafy greens, whole grain breads, oatmeal and other grains.  It is also important to drink at least eight 8oz glass of water everyday.  ? ?If you have not has a bowel movement in 4-5 days you made need to clean out your bowel.  This will have establish normal movement through your bowel.   ? ?Miralax Clean out ?Take 8 capfuls of miralax in 64 oz of gatorade. You can use any fluid that appeals to you (gatorade, water, juice) ?Continue to drink at least eight 8 oz glasses of water throughout the day ?You can repeat with another 8 capfuls of miralax in 64 oz of gatorade if you are not having a large amount of stools ?You will need to be at home and close to a bathroom for about 8 hours when you do the above as you may need to go to the bathroom frequently.  ? ?After you are cleaned out: ?- Start Colace100mg twice daily ?- Start Miralax once daily ?- Start a daily fiber supplement like metamucil or citrucel ?- You can safely use enemas in pregnancy  ?- if you are having diarrhea you can reduce to Colace once a day or miralax every other   day or a 1/2 capful daily.  ? ?

## 2022-11-19 NOTE — MAU Provider Note (Signed)
History     CSN: [id number]  Arrival date and time: 11/19/22 1526   None    Chief Complaint  Patient presents with   Constipation   Abdominal Pain   Nausea   HPI Linda Cross is a 21 y.o. G2P0010 at [redacted]w[redacted]d who presents to MAU via EMS for abdominal pain and constipation. Patient reports constipation has been an ongoing issue for the last several weeks, however over the past 3 days it has worsened. She reports she was able to pass a small amount of hard stool yesterday. She is able to pass gas. She reports she tried eating prunes however it has only caused her to have gas. The abdominal cramping started about 2 hours ago after she ate the prunes. She has not taken any medications. She has had a lot of nausea and vomiting throughout this pregnancy and has been taking Zofran without relief. She reports she did take one of her mom's promethazine tablets which helped nausea tremendously. She reports she does not have nausea currently, but often spits a lot which makes her nauseous. No vaginal bleeding, discharge, or urinary s/s.  [facility] receives [facility].    OB History     Gravida  2   Para  0   Term  0   Preterm  0   AB  1   Living  0      SAB  0   IAB  0   Ectopic  0   Multiple  0   Live Births              Past Medical History:  Diagnosis Date   Anemia    Anxiety    Cyclical vomiting    Depression    GERD (gastroesophageal reflux disease)    Kidney stones    Ovarian cyst     Past Surgical History:  Procedure Laterality Date   arm surgery Left    TONSILLECTOMY      Family History  Problem Relation Age of Onset   Heart disease Mother        rt carotid artery is blocked   Depression Mother    Anxiety disorder Mother    Migraines Mother    Fibromyalgia Mother    Hydrocephalus Mother        has shunt   Depression Father    Anxiety disorder Father    Migraines Father    Crohn's disease Cousin     Social History   Tobacco Use    Smoking status: Never   Smokeless tobacco: Never  Vaping Use   Vaping Use: Never used  Substance Use Topics   Alcohol use: No    Alcohol/week: 0.0 standard drinks of alcohol   Drug use: No    Allergies: No Known Allergies  No medications prior to admission.   Review of Systems  Constitutional: Negative.   Gastrointestinal:  Positive for abdominal pain and constipation.  Genitourinary: Negative.   Musculoskeletal: Negative.   Neurological: Negative.    Physical Exam   Blood pressure 113/65, pulse 87, temperature 98.4 F (36.9 C), temperature source Oral, resp. rate 18, last menstrual period 08/20/2022, SpO2 100 %.  Physical Exam Vitals and nursing note reviewed.  Constitutional:      General: She is not in acute distress. Cardiovascular:     Rate and Rhythm: Normal rate.  Pulmonary:     Effort: Pulmonary effort is normal.  Abdominal:     Palpations: Abdomen is soft.  Tenderness: There is abdominal tenderness in the left lower quadrant. There is no guarding or rebound. Negative signs include Murphy's sign and McBurney's sign.  Skin:    General: Skin is warm and dry.  Neurological:     General: No focal deficit present.     Mental Status: She is alert and oriented to person, place, and time.  Psychiatric:        Mood and Affect: Mood normal.        Behavior: Behavior normal.    FHR: 164 bpm via doppler  MAU Course  Procedures  MDM Promethazine SMOG enema  Patient requesting something for nausea before enema. Promethazine PO ordered.   Large amount of stool produced after enema. Patient reports feeling much better and pain has improved.   Reviewed healthy bowel habits, will send rx for colace and Miralax. Will also send robinul and promethazine. May take Zofran prn if promethazine does not help. Encouraged increased fiber and water intake. List of safe meds provided and reviewed.   Assessment and Plan  [redacted] weeks gestation of pregnancy Constipation  affecting pregnancy  - Discharge home in stable condition - Rx's sent to pharmacy - Return precautions given. Return to MAU as needed for new/worsening symptoms - Keep OB appointment as scheduled   Brand Males, CNM 11/19/2022, 6:19 PM

## 2022-11-28 NOTE — L&D Delivery Note (Signed)
Delivery Note 22 y.o. [id number] at [redacted]w[redacted]d admitted with preterm labor, she was fully dilated at the time of arrival and proceeded to have a precipitous delivery.  At 6:43 AM a viable female was delivered via Vaginal, Spontaneous (Presentation: Left Occiput Anterior).  APGAR: 8, 9; weight 3 lb 14.4 oz (1770 g).   Placenta status: Spontaneous;Pathology, Intact.  Cord: 3 vessels with the following complications: None.  Cord pH: not collected  Anesthesia: None Episiotomy: None Lacerations: None Suture Repair:  None Est. Blood Loss (mL): 67  Mom to postpartum.  Baby to NICU.  Sheppard Evens MD MPH OB Fellow, Faculty Practice [facility], Center for [facility] 04/06/2023

## 2023-01-12 LAB — OB RESULTS CONSOLE RUBELLA ANTIBODY, IGM: Rubella: IMMUNE

## 2023-02-14 LAB — OB RESULTS CONSOLE RPR: RPR: NONREACTIVE

## 2023-02-14 LAB — OB RESULTS CONSOLE HIV ANTIBODY (ROUTINE TESTING): HIV: NONREACTIVE

## 2023-04-06 ENCOUNTER — Other Ambulatory Visit: Payer: Self-pay

## 2023-04-06 ENCOUNTER — Encounter (HOSPITAL_COMMUNITY): Payer: Self-pay | Admitting: Obstetrics and Gynecology

## 2023-04-06 ENCOUNTER — Inpatient Hospital Stay (HOSPITAL_COMMUNITY)
Admission: AD | Admit: 2023-04-06 | Discharge: 2023-04-08 | DRG: 806 | Disposition: A | Discharge status: Home / Self Care | Payer: Medicaid Other | Attending: Obstetrics and Gynecology | Admitting: Obstetrics and Gynecology

## 2023-04-06 DIAGNOSIS — O365930 Maternal care for other known or suspected poor fetal growth, third trimester, not applicable or unspecified: Secondary | ICD-10-CM | POA: Diagnosis present

## 2023-04-06 DIAGNOSIS — Z3A32 32 weeks gestation of pregnancy: Secondary | ICD-10-CM

## 2023-04-06 DIAGNOSIS — O26873 Cervical shortening, third trimester: Secondary | ICD-10-CM | POA: Diagnosis present

## 2023-04-06 DIAGNOSIS — Z3490 Encounter for supervision of normal pregnancy, unspecified, unspecified trimester: Secondary | ICD-10-CM

## 2023-04-06 DIAGNOSIS — O42011 Preterm premature rupture of membranes, onset of labor within 24 hours of rupture, first trimester: Secondary | ICD-10-CM | POA: Diagnosis not present

## 2023-04-06 LAB — CBC
HCT: 33.5 % — ABNORMAL LOW (ref 36.0–46.0)
Hemoglobin: 10.3 g/dL — ABNORMAL LOW (ref 12.0–15.0)
MCH: 22.2 pg — ABNORMAL LOW (ref 26.0–34.0)
MCHC: 30.7 g/dL (ref 30.0–36.0)
MCV: 72 fL — ABNORMAL LOW (ref 80.0–100.0)
Platelets: 260 10*3/uL (ref 150–400)
RBC: 4.65 MIL/uL (ref 3.87–5.11)
RDW: 20.1 % — ABNORMAL HIGH (ref 11.5–15.5)
WBC: 16 10*3/uL — ABNORMAL HIGH (ref 4.0–10.5)
nRBC: 0 % (ref 0.0–0.2)

## 2023-04-06 LAB — TYPE AND SCREEN
ABO/RH(D): O POS
Antibody Screen: NEGATIVE

## 2023-04-06 LAB — RPR: RPR Ser Ql: NONREACTIVE

## 2023-04-06 MED ORDER — ACETAMINOPHEN 325 MG PO TABS: 650.0000 mg | ORAL_TABLET | ORAL | Status: DC | PRN

## 2023-04-06 MED ORDER — COCONUT OIL OIL
1.0000 | TOPICAL_OIL | Status: DC | PRN
  Administered 2023-04-06: 1 via TOPICAL

## 2023-04-06 MED ORDER — LIDOCAINE HCL (PF) 1 % IJ SOLN: 30.0000 mL | INTRAMUSCULAR | Status: DC | PRN

## 2023-04-06 MED ORDER — OXYCODONE-ACETAMINOPHEN 5-325 MG PO TABS: 1.0000 | ORAL_TABLET | ORAL | Status: DC | PRN

## 2023-04-06 MED ORDER — OXYCODONE HCL 5 MG PO TABS: 5.0000 mg | ORAL_TABLET | ORAL | Status: DC | PRN

## 2023-04-06 MED ORDER — SODIUM CHLORIDE 0.9% FLUSH: 3.0000 mL | Freq: Two times a day (BID) | INTRAVENOUS | Status: DC

## 2023-04-06 MED ORDER — IBUPROFEN 600 MG PO TABS
600.0000 mg | ORAL_TABLET | Freq: Four times a day (QID) | ORAL | Status: DC
  Administered 2023-04-06 – 2023-04-08 (×8): 600 mg via ORAL
  Filled 2023-04-06 (×8): qty 1

## 2023-04-06 MED ORDER — PRENATAL MULTIVITAMIN CH
1.0000 | ORAL_TABLET | Freq: Every day | ORAL | Status: DC
  Administered 2023-04-06 – 2023-04-07 (×2): 1 via ORAL
  Filled 2023-04-06 (×2): qty 1

## 2023-04-06 MED ORDER — ONDANSETRON HCL 4 MG PO TABS: 4.0000 mg | ORAL_TABLET | ORAL | Status: DC | PRN

## 2023-04-06 MED ORDER — DIBUCAINE (PERIANAL) 1 % EX OINT: 1.0000 | TOPICAL_OINTMENT | CUTANEOUS | Status: DC | PRN

## 2023-04-06 MED ORDER — SENNOSIDES-DOCUSATE SODIUM 8.6-50 MG PO TABS
2.0000 | ORAL_TABLET | ORAL | Status: DC
  Administered 2023-04-07: 2 via ORAL
  Filled 2023-04-06 (×2): qty 2

## 2023-04-06 MED ORDER — SODIUM CHLORIDE 0.9% FLUSH: 3.0000 mL | INTRAVENOUS | Status: DC | PRN

## 2023-04-06 MED ORDER — SIMETHICONE 80 MG PO CHEW: 80.0000 mg | CHEWABLE_TABLET | ORAL | Status: DC | PRN

## 2023-04-06 MED ORDER — BENZOCAINE-MENTHOL 20-0.5 % EX AERO: 1.0000 | INHALATION_SPRAY | CUTANEOUS | Status: DC | PRN

## 2023-04-06 MED ORDER — OXYTOCIN-SODIUM CHLORIDE 30-0.9 UT/500ML-% IV SOLN: 2.5000 [IU]/h | INTRAVENOUS | Status: DC

## 2023-04-06 MED ORDER — ONDANSETRON HCL 4 MG/2ML IJ SOLN: 4.0000 mg | Freq: Four times a day (QID) | INTRAMUSCULAR | Status: DC | PRN

## 2023-04-06 MED ORDER — ONDANSETRON HCL 4 MG/2ML IJ SOLN: 4.0000 mg | INTRAMUSCULAR | Status: DC | PRN

## 2023-04-06 MED ORDER — SODIUM CHLORIDE 0.9 % IV SOLN: INTRAVENOUS | Status: DC | PRN

## 2023-04-06 MED ORDER — ACETAMINOPHEN 325 MG PO TABS
650.0000 mg | ORAL_TABLET | ORAL | Status: DC | PRN
  Administered 2023-04-06 – 2023-04-07 (×3): 650 mg via ORAL
  Filled 2023-04-06 (×3): qty 2

## 2023-04-06 MED ORDER — WITCH HAZEL-GLYCERIN EX PADS: 1.0000 | MEDICATED_PAD | CUTANEOUS | Status: DC | PRN

## 2023-04-06 MED ORDER — ZOLPIDEM TARTRATE 5 MG PO TABS: 5.0000 mg | ORAL_TABLET | Freq: Every evening | ORAL | Status: DC | PRN

## 2023-04-06 MED ORDER — LACTATED RINGERS IV SOLN: INTRAVENOUS | Status: DC

## 2023-04-06 MED ORDER — DIPHENHYDRAMINE HCL 25 MG PO CAPS: 25.0000 mg | ORAL_CAPSULE | Freq: Four times a day (QID) | ORAL | Status: DC | PRN

## 2023-04-06 MED ORDER — SOD CITRATE-CITRIC ACID 500-334 MG/5ML PO SOLN: 30.0000 mL | ORAL | Status: DC | PRN

## 2023-04-06 MED ORDER — OXYTOCIN-SODIUM CHLORIDE 30-0.9 UT/500ML-% IV SOLN
INTRAVENOUS | Status: AC
  Filled 2023-04-06: qty 500

## 2023-04-06 MED ORDER — LACTATED RINGERS IV SOLN: 500.0000 mL | INTRAVENOUS | Status: DC | PRN

## 2023-04-06 MED ORDER — OXYTOCIN BOLUS FROM INFUSION: 333.0000 mL | Freq: Once | INTRAVENOUS | Status: DC

## 2023-04-06 MED ORDER — OXYCODONE-ACETAMINOPHEN 5-325 MG PO TABS: 2.0000 | ORAL_TABLET | ORAL | Status: DC | PRN

## 2023-04-06 NOTE — H&P (Signed)
OBSTETRIC ADMISSION HISTORY AND PHYSICAL  Linda Cross is a 22 y.o. female G2P0010 with IUP at [redacted]w[redacted]d by LMP presenting for preterm labor. She has had contractions intermittently for the past few weeks. Received prenatal care with [facility], and was diagnosed with a shortened cervix. Patient's mom reports that she received 2 doses of betamethasone about 3 weeks ago, due to PTL. Could not find records of this, however some prenatal records are available on care everywhere. Also has a history of FGR, but with normal umbilical artery dopplers.   She reports +FMs, No LOF, no VB, no blurry vision, headaches or peripheral edema, and RUQ pain.  She plans on breast feeding. She is undecided about birth control. She received her prenatal care at [facility]   Dating: By LMP --->  Estimated Date of Delivery: 05/27/23  Sono:    @~ 28w CWD, normal anatomy, breech presentation, 1097g, 37% EFW   Prenatal History/Complications:  Preterm labor 2/2 shortened cervix Fetal growth restriction with normal UA dopplers.  Past Medical History: Past Medical History:  Diagnosis Date   Anemia    Anxiety    Cyclical vomiting    Depression    GERD (gastroesophageal reflux disease)    Kidney stones    Ovarian cyst     Past Surgical History: Past Surgical History:  Procedure Laterality Date   arm surgery Left    TONSILLECTOMY      Obstetrical History: OB History     Gravida  2   Para  1   Term  0   Preterm  1   AB  1   Living  1      SAB  0   IAB  0   Ectopic  0   Multiple  0   Live Births  1           Social History Social History   Socioeconomic History   Marital status: Single    Spouse name: Not on file   Number of children: Not on file   Years of education: Not on file   Highest education level: Not on file  Occupational History   Not on file  Tobacco Use   Smoking status: Never   Smokeless tobacco: Never  Vaping Use   Vaping Use: Never used  Substance and Sexual  Activity   Alcohol use: No    Alcohol/week: 0.0 standard drinks of alcohol   Drug use: No   Sexual activity: Not Currently  Other Topics Concern   Not on file  Social History Narrative   In 6th grade, doing very well in school. Lives with mom and brother. No smoke exposure.   Social Determinants of Health   Financial Resource Strain: Not on file  Food Insecurity: No Food Insecurity (04/06/2023)   Hunger Vital Sign    Worried About Running Out of Food in the Last Year: Never true    Ran Out of Food in the Last Year: Never true  Transportation Needs: No Transportation Needs (04/06/2023)   PRAPARE - Administrator, Civil Service (Medical): No    Lack of Transportation (Non-Medical): No  Physical Activity: Not on file  Stress: Not on file  Social Connections: Not on file    Family History: Family History  Problem Relation Age of Onset   Heart disease Mother        rt carotid artery is blocked   Depression Mother    Anxiety disorder Mother    Migraines Mother  Fibromyalgia Mother    Hydrocephalus Mother        has shunt   Depression Father    Anxiety disorder Father    Migraines Father    Crohn's disease Cousin     Allergies: No Known Allergies  Medications Prior to Admission  Medication Sig Dispense Refill Last Dose   docusate sodium (COLACE) 250 MG capsule Take 1 capsule (250 mg total) by mouth daily. 10 capsule 0    glycopyrrolate (ROBINUL) 1 MG tablet Take 1 tablet (1 mg total) by mouth 3 (three) times daily. 90 tablet 0    lidocaine (XYLOCAINE) 2 % solution Use as directed 5 mLs in the mouth or throat as needed for mouth pain. 50 mL 0    omeprazole (PRILOSEC) 40 MG capsule Take 40 mg by mouth daily.      ondansetron (ZOFRAN) 4 MG tablet Take 1 tablet (4 mg total) by mouth every 8 (eight) hours as needed for nausea or vomiting. 30 tablet 2    polyethylene glycol (MIRALAX) 17 g packet Take 17 g by mouth daily. 14 each 0    Prenatal Vit-Fe Fumarate-FA  (PRENATAL VITAMIN) 27-0.8 MG TABS Take 1 tablet by mouth daily. 90 tablet 2    promethazine (PHENERGAN) 25 MG tablet Take 1 tablet (25 mg total) by mouth every 6 (six) hours as needed for nausea or vomiting. 30 tablet 0      Review of Systems   All systems reviewed and negative except as stated in HPI  Blood pressure 123/76, pulse 87, temperature 98.2 F (36.8 C), temperature source Oral, resp. rate 16, height 5\' 5"  (1.651 m), weight 63 kg, last menstrual period 08/20/2022, SpO2 100 %, unknown if currently breastfeeding.  General appearance: alert, cooperative, and appears stated age Lungs: clear to auscultation bilaterally Heart: regular rate and rhythm Abdomen: soft, non-tender; bowel sounds normal Pelvic: see below. Extremities: Homans sign is negative, no sign of DVT  Presentation: cephalic Fetal monitoring: 140 bpm, moderate variability, +accels, intermittent variable decels Uterine activity: contractions every 2-3 mins  Dilation: 10 Exam by:: Dr. Verta Ellen   Prenatal labs: ABO, Rh: --/--/O POS (05/09 1610) Antibody: NEG (05/09 9604) Rubella: Immune (02/15 0000) RPR: NON REACTIVE (05/09 5409)  HBsAg: Negative (10/11 0000)  HIV: Non-reactive (03/19 0000)  GBS:    1 hr Glucola normal Genetic screening  low risk (per careeverywhere chart) Anatomy US normal  Prenatal Transfer Tool  Maternal Diabetes: No Genetic Screening: Normal Maternal Ultrasounds/Referrals: IUGR Fetal Ultrasounds or other Referrals:  Referred to Materal Fetal Medicine  Maternal Substance Abuse:  No Significant Maternal Medications:  None Significant Maternal Lab Results:  GBS unknown Number of Prenatal Visits:greater than 3 verified prenatal visits Other Comments:  None  Results for orders placed or performed during the hospital encounter of 04/06/23 (from the past 24 hour(s))  CBC   Collection Time: 04/06/23  6:38 AM  Result Value Ref Range   WBC 16.0 (H) 4.0 - 10.5 K/uL   RBC 4.65 3.87 - 5.11  MIL/uL   Hemoglobin 10.3 (L) 12.0 - 15.0 g/dL   HCT 81.1 (L) 91.4 - 78.2 %   MCV 72.0 (L) 80.0 - 100.0 fL   MCH 22.2 (L) 26.0 - 34.0 pg   MCHC 30.7 30.0 - 36.0 g/dL   RDW 95.6 (H) 21.3 - 08.6 %   Platelets 260 150 - 400 K/uL   nRBC 0.0 0.0 - 0.2 %  RPR   Collection Time: 04/06/23  6:38 AM  Result Value  Ref Range   RPR Ser Ql NON REACTIVE NON REACTIVE  Type and screen [facility]   Collection Time: 04/06/23  6:38 AM  Result Value Ref Range   ABO/RH(D) O POS    Antibody Screen NEG    Sample Expiration      04/09/2023,2359 Performed at [facility], [address], [place], [place] [address]     Patient Active Problem List   Diagnosis Date Noted   Pregnancy 04/06/2023   SVD (spontaneous vaginal delivery) 04/06/2023   Preterm delivery (maternal condition) 04/06/2023   Infectious colitis 03/05/2014   Diarrhea 02/16/2014   Dehydration 02/16/2014   Hx of Salmonella infection 02/16/2014   Cyclical vomiting     Assessment/Plan:  Viola Wittekind is a 22 y.o. G2P0010 at [redacted]w[redacted]d here for preterm spontaneous onset of labor. She is fully dilated on arrival.  #Labor:Admit to labor and delivery #Pain: none #FWB: Cat 2, due to intermittent variable decels, but overall reassuring FHR #ID:  GBS unknown. Did not have time to get antibiotics in labor. #MOF:  breast #MOC: undecided #Circ:  N/a  Sheppard Evens MD MPH OB Fellow, Faculty Practice [facility], Center for [facility] 04/06/2023

## 2023-04-06 NOTE — MAU Note (Signed)
Patient Access called @ [phone] stating that "patient is having CTX back to back and has the urge to push or poop". Charge RN went out to pull patient back to a room, called for CNM, Mayford Knife, for bedside evaluation. PT reports CTX since Tuesday on and off, and since 0300 more intense and every 3-5 mins, with pain rating of 10/10. Pt denies DFM, VB, LOF, and previous complications of Preterm Labor. Pt was assisted to bed and EFM placed with a FHR of 136 @ 0613. CNM performed SVE, 10/100 with a bulging bag of fluid and Vertex. CNM called 1st call L/D Ndulue, MD for orders @ 0615. Charge RN called L/D Consulting civil engineer for report and bed placement @0616 . Pt was transported to L/D with CNM at bedside.

## 2023-04-06 NOTE — Plan of Care (Signed)
Ashford Clouse, RN 

## 2023-04-06 NOTE — Lactation Note (Signed)
This note was copied from a baby's chart. Lactation Consultation Note  Patient Name: Linda Cross ZOXWR'[id number] Date: 04/06/2023 Age:21 hours    Attempted to visit with family, but Linda Cross wasn't in the room in [place]. Noticed that she already has a DEBP set up in the room. NICU LC to follow up later for initial assessment.    Thai Burgueno S Savina Olshefski 04/06/2023, 1:49 PM

## 2023-04-06 NOTE — Lactation Note (Signed)
This note was copied from a baby's chart.  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 She voiced that "nothing came out". Explained that the importance of pumping this ealry on is mainly for breast stimulation and not to get volume, she voiced understanding. Assisted with hand expression (no colostrum note yet); her plan is to keep baby exclusively on breastmilk. Reviewed pumping schedule, lactogenesis II, benefits of premature milk and anticipatory guidelines.  OBJECTIVE Infant data: Mother's Current Feeding Choice: -- (NPO)  Infant feeding assessment No data recorded  Maternal data: G2P0111  Vaginal, Spontaneous Has patient been taught Hand Expression?: Yes Hand Expression Comments: no colostrum noted at this time Significant Breast History:: moderate breast changes during the pregnancy Current breast feeding challenges:: NICU admission Does the patient have breastfeeding experience prior to this delivery?: No Pumping frequency: started pumping at 7 hours post-partum Pumped volume: 0 mL Flange Size: 21 Risk factor for low milk supply:: primipara, prematurity, infant separation  Pump: Personal (DEBP at home)  ASSESSMENT Infant: Feeding Status: NPO  Maternal: Milk volume: Normal  INTERVENTIONS/PLAN Interventions: Interventions: Breast feeding basics reviewed; Coconut oil; DEBP; Education; [employer] brochure Tools: Pump; Flanges; Coconut oil Pump Education: Setup, frequency, and cleaning; Milk Storage  Plan: Encouraged pumping every 3 hours,  ideally 8 pumping sessions/24 hours Breast massage, hand expression and coconut oil were also encouraged prior pumping  No other support person at this time. All questions and concerns answered, family to contact [facility] services PRN.  Consult Status: NICU follow-up NICU Follow-up type: New admission follow up   Linda Cross 04/06/2023, 3:18 PM

## 2023-04-06 NOTE — Discharge Summary (Signed)
Postpartum Discharge Summary   Patient Name: Linda Cross DOB: 08/07/2001 MRN: [id number]  Date of admission: 04/06/2023 Delivery date:04/06/2023  Delivering provider: Alfredia Ferguson  Date of discharge: 04/08/2023  Admitting diagnosis: Pregnancy [Z34.90] Intrauterine pregnancy: [redacted]w[redacted]d     Secondary diagnosis:  Principal Problem:   SVD (spontaneous vaginal delivery) Active Problems:   Pregnancy   Preterm delivery (maternal condition)  Additional problems: None    Discharge diagnosis: Preterm Pregnancy Delivered                                              Post partum procedures: none Augmentation: AROM Complications: Preterm delivery  Hospital course: Onset of Labor With Vaginal Delivery      22 y.o. yo G2P0111 at [redacted]w[redacted]d was admitted in Active Labor on 04/06/2023. Labor course was complicated by pre-maturity  Membrane Rupture Time/Date: 6:41 AM ,04/06/2023   Delivery Method:Vaginal, Spontaneous  Episiotomy: None  Lacerations:  None  Patient had an uncomplicated postpartum course.  She is ambulating, tolerating a regular diet, passing flatus, and urinating well. Patient is discharged home in stable condition on 04/08/23.  Newborn Data: Birth date:04/06/2023  Birth time:6:43 AM  Gender:Female  Living status:Living  Apgars:8 ,9  Weight:1770 g   Magnesium Sulfate received: No BMZ received: None during this admission. Patient reports receiving BMZ 3 weeks earlier at an outside hospital due to to PTL Rhophylac:N/A MMR:N/A T-DaP:Given prenatally Flu: N/A Transfusion:No  Physical exam  Vitals:   04/07/23 1215 04/07/23 1456 04/07/23 2222 04/08/23 0519  BP:  119/73 116/63 118/69  Pulse:  73 71 74  Resp:  16  17  Temp:  97.9 F (36.6 C) 97.9 F (36.6 C) 98 F (36.7 C)  TempSrc:  Oral Oral Oral  SpO2: 100% 100% 100% 100%  Weight:      Height:       General: alert, cooperative, and no distress Lochia: appropriate Uterine Fundus: firm DVT Evaluation: No evidence of  DVT seen on physical exam. Labs: Lab Results  Component Value Date   WBC 16.0 (H) 04/06/2023   HGB 10.3 (L) 04/06/2023   HCT 33.5 (L) 04/06/2023   MCV 72.0 (L) 04/06/2023   PLT 260 04/06/2023      Latest Ref Rng & Units 09/28/2022    9:42 AM  CMP  Glucose 70 - 99 mg/dL 91   BUN 6 - 20 mg/dL <5   Creatinine 7.82 - 1.00 mg/dL 9.56   Sodium 213 - 086 mmol/L 136   Potassium 3.5 - 5.1 mmol/L 4.3   Chloride 98 - 111 mmol/L 107   CO2 22 - 32 mmol/L 23   Calcium 8.9 - 10.3 mg/dL 9.4   Total Protein 6.5 - 8.1 g/dL 7.4   Total Bilirubin 0.3 - 1.2 mg/dL 0.5   Alkaline Phos 38 - 126 U/L 47   AST 15 - 41 U/L 18   ALT 0 - 44 U/L 13    Edinburgh Score:    04/07/2023   12:09 PM  Edinburgh Postnatal Depression Scale Screening Tool  I have been able to laugh and see the funny side of things. 0  I have looked forward with enjoyment to things. 0  I have blamed myself unnecessarily when things went wrong. 0  I have been anxious or worried for no good reason. 0  I have felt scared or panicky for  no good reason. 0  Things have been getting on top of me. 0  I have been so unhappy that I have had difficulty sleeping. 0  I have felt sad or miserable. 0  I have been so unhappy that I have been crying. 0  The thought of harming myself has occurred to me. 0  Edinburgh Postnatal Depression Scale Total 0     After visit meds:  Allergies as of 04/08/2023   No Known Allergies      Medication List     STOP taking these medications    glycopyrrolate 1 MG tablet Commonly known as: Robinul   promethazine 25 MG tablet Commonly known as: PHENERGAN       TAKE these medications    acetaminophen 325 MG tablet Commonly known as: Tylenol Take 2 tablets (650 mg total) by mouth every 6 (six) hours as needed.   docusate sodium 250 MG capsule Commonly known as: COLACE Take 1 capsule (250 mg total) by mouth daily.   ibuprofen 600 MG tablet Commonly known as: ADVIL Take 1 tablet (600 mg  total) by mouth every 6 (six) hours.   lidocaine 2 % solution Commonly known as: XYLOCAINE Use as directed 5 mLs in the mouth or throat as needed for mouth pain.   omeprazole 40 MG capsule Commonly known as: PRILOSEC Take 40 mg by mouth daily.   ondansetron 4 MG tablet Commonly known as: Zofran Take 1 tablet (4 mg total) by mouth every 8 (eight) hours as needed for nausea or vomiting.   polyethylene glycol 17 g packet Commonly known as: MiraLax Take 17 g by mouth daily.   Prenatal Vitamin 27-0.8 MG Tabs Take 1 tablet by mouth daily.         Discharge home in stable condition Infant Feeding:  In NICU - patient pumping Infant Disposition:NICU, doing well on day of discharge Discharge instruction: per After Visit Summary and Postpartum booklet. Activity: Advance as tolerated. Pelvic rest for 6 weeks.  Diet: routine diet Future Appointments:No future appointments. Follow up Visit:  Follow-up Information     [facility] for [facility] Follow up in 6 week(s).   Specialty: Obstetrics and Gynecology Why: We will call you to schedule a postpartum appointment. Please call this number if you have questions or concerns about your delivery. Contact information: [address] [place] [address] [phone]                Patient prefers to follow up with [facility] for her postpartum care. Message routed to [facility] for scheduling in 4-6 weeks. Additional Postpartum F/U: none   High risk pregnancy complicated by:  preterm labor, FGB Delivery mode:  Vaginal, Spontaneous  Anticipated Birth Control:  Declines. Counseled on risks of short interval pregnancy and recommendation for 18 months before next pregnancy.  04/08/2023 Lennart Pall, MD

## 2023-04-07 NOTE — Progress Notes (Signed)
Post Partum Day 1 Subjective: no complaints  Objective: Blood pressure (!) 105/53, pulse 88, temperature 98 F (36.7 C), temperature source Oral, resp. rate 16, height 5\' 5"  (1.651 m), weight 63 kg, last menstrual period 08/20/2022, SpO2 100 %, unknown if currently breastfeeding.  Physical Exam:  General: alert, cooperative, and no distress Lochia: appropriate Uterine Fundus: firm Incision:  DVT Evaluation: No evidence of DVT seen on physical exam.  Recent Labs    04/06/23 0638  HGB 10.3*  HCT 33.5*    Assessment/Plan: Breastfeeding   LOS: 1 day   Scheryl Darter, MD 04/07/2023, 7:16 AM

## 2023-04-07 NOTE — Lactation Note (Addendum)
This note was copied from a baby's chart.  NICU Lactation Consultation Note  Patient Name: Linda Cross YNWGN'F Date: 04/07/2023 Age:21 hours Reason for consult: Follow-up assessment; NICU baby; Preterm <34wks; Infant < 6lbs; Primapara; 1st time breastfeeding   SUBJECTIVE  LC in to visit with P1 Mom of preterm baby Linda delivered vaginally.  Baby currently resting comfortably STS with Mom.  LC assisted to recline Mom back a little further as baby was buckled on Mom's abdomen. Assisted baby to be STS on Mom's chest, with head turned to side.  Mom has been consistently pumping and bringing up small volumes of colostrum for baby.  Mom has the milk labels.  Mom provided with a hand's free pumping band and a Pumping Log for her to keep track of her pumping.    Mom also states she has WIC, [facility] faxed a [facility] referral.  Mom may be discharged and does know that baby's room has a DEBP for her use.  Encouraged Mom to double pump after every STS session with baby.  OBJECTIVE Infant data: Mother's Current Feeding Choice: Breast Milk and Donor Milk  Infant feeding assessment No data recorded  Maternal data: G2P0111  Vaginal, Spontaneous Has patient been taught Hand Expression?: Yes Hand Expression Comments: no colostrum noted at this time Significant Breast History:: moderate breast changes during the pregnancy Current breast feeding challenges:: NICU admission Does the patient have breastfeeding experience prior to this delivery?: No Pumping frequency: Every 3 hrs Pumped volume: 3 mL Flange Size: 21 Risk factor for low milk supply:: primipara, prematurity, infant separation  WIC Program: Yes WIC Referral Sent?: Yes What county?: Other [place]) Pump:  (Personal pump bought on [employer], unsure of brand name)  ASSESSMENT Infant: Feeding Status: Scheduled 9-12-3-6  Maternal: Milk volume: Normal  INTERVENTIONS/PLAN Interventions: Interventions: Breast feeding basics reviewed;  Skin to skin; Breast massage; Hand express; DEBP; Education Tools: Pump; Flanges; Hands-free pumping top Pump Education: Setup, frequency, and cleaning; Milk Storage (LC set up 2 bins (washing and drying))  Plan: Consult Status: NICU follow-up NICU Follow-up type: Verify absence of engorgement; Verify onset of copious milk   Linda Cross 04/07/2023, 4:51 PM

## 2023-04-08 ENCOUNTER — Ambulatory Visit (HOSPITAL_COMMUNITY): Payer: Self-pay

## 2023-04-08 MED ORDER — IBUPROFEN 600 MG PO TABS: 600.0000 mg | ORAL_TABLET | Freq: Four times a day (QID) | ORAL | 0 refills | Status: AC

## 2023-04-08 MED ORDER — ACETAMINOPHEN 325 MG PO TABS: 650.0000 mg | ORAL_TABLET | Freq: Four times a day (QID) | ORAL | 0 refills | Status: AC | PRN

## 2023-04-08 NOTE — Lactation Note (Signed)
This note was copied from a baby's chart.  NICU Lactation Consultation Note  Patient Name: Linda Cross ZOXWR'U Date: 04/08/2023 Age:21 hours  Reason for consult: Follow-up assessment; NICU baby; Preterm <34wks; Infant < 6lbs  SUBJECTIVE  LC visit with P1 Mom of preterm baby in the NICU.  Baby Linda Cross is currently STS with Mom.  Mom has been consistently pumping and now expressing 15 ml colostrum which will be fed to baby by gavage.  Mom has done lick and learn at the breast with baby.  Mom DC'd today and plans to room-in with baby and utilize the [employer] pump in the room.  LC set up washing and drying bins, and washed the pump parts as she had just pumped.  Mom's goal is to direct breastfeed before bottles if possible.  Mom plans to go home and shower, but will be right back.  Offered a [facility] loaner, but Mom says she won't be gone but an hour.  Showed how to use the hand pump attachment.  Engorgement prevention and treatment reviewed.   OBJECTIVE Infant data: Mother's Current Feeding Choice: Breast Milk and Donor Milk  Infant feeding assessment No data recorded  Maternal data: G2P0111  Vaginal, Spontaneous Pumping frequency: 8 times per 24 hrs Pumped volume: 15 mL Flange Size: 21  WIC Program: Yes WIC Referral Sent?: Yes What county?: Other [place]) Pump:  (Personal pump bought on [employer], unsure of brand name)  ASSESSMENT Infant: Feeding Status: Scheduled 9-12-3-6  Maternal: Milk volume: Normal  INTERVENTIONS/PLAN Interventions: Interventions: Breast feeding basics reviewed; Skin to skin; Breast massage; Hand express; DEBP; Education Discharge Education: Engorgement and breast care Tools: Pump; Flanges; Hands-free pumping top Pump Education: Setup, frequency, and cleaning; Milk Storage  Plan: Consult Status: NICU follow-up NICU Follow-up type: Verify absence of engorgement; Verify onset of copious milk; Maternal D/C visit   [name] 04/08/2023, 10:37 AM

## 2023-04-09 ENCOUNTER — Ambulatory Visit (HOSPITAL_COMMUNITY): Payer: Self-pay

## 2023-04-09 NOTE — Lactation Note (Signed)
This note was copied from a baby's chart.  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 Lorden is a P1 and reports pumping is going well, she's been pumping consistently, her milk is in. NICU RN Misty Stanley called this LC for assistance with the 3 pm feeding per provider's suggestion; baby "Nino Parsley" has been waking up before her feeds and cueing. LC took baby to the R pre-pumped breast in cross cradle hold, she would suck consistently on a gloved finger but not at the breast (see LATCH score). Reviewed pumping schedule, lactogenesis II, IDF 1/2, pre-feeding activities and anticipatory guidelines.  OBJECTIVE Infant data: Mother's Current Feeding Choice: Breast Milk and Donor Milk  Infant feeding assessment Scale for Readiness: 2   Maternal data: G2P0111  Vaginal, Spontaneous Pumping frequency: 8 times/24 hours Pumped volume: 30 mL (30-90 ml) Flange Size: 21  WIC Program: Yes WIC Referral Sent?: Yes What county?: Other [place]) Pump:  (Personal pump bought on [employer], unsure of brand name)  ASSESSMENT Infant: LATCH Documentation Latch: 1 (baby "latched" but only a few sucks before falling asleep) Audible Swallowing: 0 Type of Nipple: 2 Comfort (Breast/Nipple): 2 Hold (Positioning): 1 LATCH Score: 6  Feeding Status: Scheduled 9-12-3-6  Maternal: Milk volume: Normal  INTERVENTIONS/PLAN Interventions: Interventions: Breast feeding basics reviewed; Assisted with latch; Hand express; Breast compression; Adjust position; Support pillows; DEBP; Education; Infant Driven Feeding Algorithm education Discharge  Education: Engorgement and breast care Tools: Pump; Flanges; Hands-free pumping top Pump Education: Setup, frequency, and cleaning; Milk Storage  Plan: Encouraged to continue pumping every 3 hours, ideally 8 pumping sessions/24 hours She'll take baby to a pumped breast on feeding cues at feeding times and will call for assistance PRN. LC to try a NS # 16-20 for the next feeding assist    MGM present and very supportive. All questions and concerns answered, family to contact [facility] services PRN.  Consult Status: NICU follow-up NICU Follow-up type: Weekly NICU follow up; Assist with IDF-1 (Mother to pre-pump before breastfeeding)   Ijanae Macapagal Venetia Constable 04/09/2023, 4:01 PM

## 2023-04-10 LAB — SURGICAL PATHOLOGY

## 2023-04-11 ENCOUNTER — Ambulatory Visit (HOSPITAL_COMMUNITY): Payer: Self-pay

## 2023-04-11 NOTE — Lactation Note (Signed)
This note was copied from a baby's chart.  NICU Lactation Consultation Note  Patient Name: Linda Cross [id number]'V Date: 04/11/2023 Age:21 days  Reason for consult: Follow-up assessment; NICU baby; Preterm <34wks; Primapara; 1st time breastfeeding  SUBJECTIVE  LC in to visit with P1 Mom of [redacted]w[redacted]d AGA baby in the NICU.  Mom is pumping consistently and expressing good volumes.  Mom reports baby is cueing mostly before each feeding.  Offered to assist with a breastfeeding tomorrow at 12 noon.  Mom will pre-pump prior to latch assist.   OBJECTIVE Infant data: No data recorded Infant feeding assessment Scale for Readiness: 1   Maternal data: G2P0111  Vaginal, Spontaneous Pumping frequency: 8 times per 24 hrs Pumped volume: 120 mL Flange Size: 21  WIC Program: Yes WIC Referral Sent?: Yes What county?: Other [place]) Pump:  (Personal pump bought on [employer], unsure of brand name)  ASSESSMENT Infant: Feeding Status: Scheduled 9-12-3-6  Maternal: Milk volume: Normal  INTERVENTIONS/PLAN Interventions: Tools: Pump; Flanges; Hands-free pumping top Pump Education: Setup, frequency, and cleaning; Milk Storage  Plan: Consult Status: NICU follow-up NICU Follow-up type: Assist with IDF-1 (Mother to pre-pump before breastfeeding)   Linda Cross 04/11/2023, 4:30 PM

## 2023-04-12 ENCOUNTER — Ambulatory Visit (HOSPITAL_COMMUNITY): Payer: Self-pay

## 2023-04-12 NOTE — Lactation Note (Signed)
This note was copied from a baby's chart.  NICU Lactation Consultation Note  Patient Name: Linda Cross YQMVH'Q Date: 04/12/2023 Age:21 days  Reason for consult: Follow-up assessment; NICU baby; 1st time breastfeeding; Primapara; Preterm <34wks; Infant < 6lbs  SUBJECTIVE  LC visited with P1 Mom of baby "Linda Cross" as she was sleeping on Mom's chest.  LC suggested STS to help baby start to cue and think about feeding.  Mom reports that baby is showing feeding cues and she will put her near the breast and baby has been trying, but no latch yet.  Mom would like LC to assist with positioning and latching tomorrow.  3pm appt made.  Mom has been pumping an expressing 120 ml each session.  LC shared with Mom how wonderful that is.  Encouraged Mom to continue her consistent pumping and baby would have an easier time transitioning to breast feeding if her milk supply remains good.  OBJECTIVE Infant data:  Infant feeding assessment Scale for Readiness: 3   Maternal data: G2P0111  Vaginal, Spontaneous Pumping frequency: 8 times per 24 hrs Pumped volume: 120 mL Flange Size: 21  WIC Program: Yes WIC Referral Sent?: Yes What county?: Other [place]) Pump:  (Personal pump bought on [employer], unsure of brand name)  ASSESSMENT Infant: Feeding Status: Scheduled 9-12-3-6  Maternal: Milk volume: Normal  INTERVENTIONS/PLAN Interventions: Interventions: Breast feeding basics reviewed; Skin to skin; Breast massage; Hand express; DEBP; Education Tools: Pump; Flanges; Hands-free pumping top Pump Education: Setup, frequency, and cleaning; Milk Storage  Plan: Consult Status: NICU follow-up NICU Follow-up type: Weekly NICU follow up   [name] 04/12/2023, 4:34 PM

## 2023-04-12 NOTE — Lactation Note (Signed)
This note was copied from a baby's chart.  NICU Lactation Consultation Note  Patient Name: Linda Cross WGNFA'O Date: 04/12/2023 Age:22 years  Reason for consult: Follow-up assessment; NICU baby; Primapara; 1st time breastfeeding; Infant < 6lbs; Preterm <34wks  SUBJECTIVE  LC in to visit with P1 Mom of baby being gavage fed.  Mom on the phone with her Mom.  Mom a little upset regarding incident with staff last evening.   Baby sleeping on Mom's chest.  Talked and listened to Mom.  Mom knows to look for feeding cues and let her RN know to call LC.  3pm feeding a possibility.  Encouraged STS with baby and offering the breast after a full pumping, for licking and learning.  Mom aware of lactation support and encouraged to ask for help prn.   OBJECTIVE Infant data: No data recorded Infant feeding assessment Scale for Readiness: 3   Maternal data: G2P0111  Vaginal, Spontaneous Pumping frequency: 8 times per 24 hrs Pumped volume: 120 mL Flange Size: 21  WIC Program: Yes WIC Referral Sent?: Yes What county?: Other [place]) Pump:  (Personal pump bought on [employer], unsure of brand name)  ASSESSMENT Infant: Feeding Status: Scheduled 9-12-3-6  Maternal: Milk volume: Normal  INTERVENTIONS/PLAN Interventions: Interventions: Breast feeding basics reviewed; Skin to skin; Breast massage; Hand express; DEBP; Education Tools: Pump; Flanges; Hands-free pumping top Pump Education: Setup, frequency, and cleaning; Milk Storage  Plan: Consult Status: NICU follow-up NICU Follow-up type: Assist with IDF-1 (Mother to pre-pump before breastfeeding)   Linda Cross 04/12/2023, 1:03 PM

## 2023-04-13 ENCOUNTER — Ambulatory Visit (HOSPITAL_COMMUNITY): Payer: Self-pay

## 2023-04-13 NOTE — Lactation Note (Signed)
This note was copied from a baby's chart.  NICU Lactation Consultation Note  Patient Name: Girl Coralai Furness ZOXWR'U Date: 04/13/2023 Age:21 days  Reason for consult: Weekly NICU follow-up; Primapara; 1st time breastfeeding; NICU baby; Breastfeeding assistance; Preterm <34wks; Maternal endocrine disorder; Infant < 6lbs Type of Endocrine Disorder?: PCOS  SUBJECTIVE Visited with family of 73 14/29 weeks old AGA NICU female; Ms. Gowans is a P1 and requested a feeding assist for the 3 pm feeding. Baby "Nino Parsley" already awake and alert and sucking on her purple paci when entered the room. This LC took her to the pre-pumped L side in cross cradle hold but she was unable to latch at the bare breast (see LATCH score). Once NS #16 was placed, she got into some short bursts of rhythmical sucking patterns on and off and stayed latch until [facility] exited the room at the 24 minutes mark. Let Ms. Detorres know that we still need to limit the feedings to < 30 minutes at a time per IDF protocol, and praised for all her efforts. She's been pumping consistently day and night and has a strong supply. Reviewed normal pre-term behavior, lactogenesis III, IDF 1/2, pre-feeding activities and anticipatory guidelines.   OBJECTIVE Infant data: Mother's Current Feeding Choice: Breast Milk  Infant feeding assessment Scale for Readiness: 2   Maternal data: G2P0111  Vaginal, Spontaneous Pumping frequency: 8 times/24 hours Pumped volume: 120 mL Flange Size: 21  WIC Program: Yes WIC Referral Sent?: Yes What county?: Other [place]) Pump:  (Personal pump bought on [employer], unsure of brand name)  ASSESSMENT Infant: LATCH Documentation Latch: 2 (with NS # 16, baby kept slipping off the breast without it) Audible Swallowing: 1 (tiny swallows noted, pool of EBM on NS # 16 when baby briefly unlatched) Type of Nipple: 2 Comfort (Breast/Nipple): 2 Hold (Positioning): 1 LATCH Score: 8  Feeding Status: Scheduled  9-12-3-6  Maternal: Milk volume: Normal  INTERVENTIONS/PLAN Interventions: Interventions: Breast feeding basics reviewed; Assisted with latch; Breast compression; Adjust position; Support pillows; Coconut oil; DEBP; Education; Infant Driven Feeding Algorithm education Tools: Pump; Flanges; Hands-free pumping top; Nipple Shields; Coconut oil Pump Education: Setup, frequency, and cleaning; Milk Storage Nipple shield size: 16  Plan: Encouraged to continue pumping every 3 hours, ideally 8 pumping sessions/24 hours She'll take baby to a pumped breast on feeding cues at feeding times and will call for assistance PRN She'll work on some pre-feeding activities such as paci dips, finger feeding and holding baby during gavage feedings   No other support person at this time. All questions and concerns answered, family to contact [facility] services PRN.  Consult Status: NICU follow-up NICU Follow-up type: Weekly NICU follow up; Assist with IDF-1 (Mother to pre-pump before breastfeeding)   Katelinn Justice Venetia Constable 04/13/2023, 3:59 PM

## 2023-04-16 ENCOUNTER — Ambulatory Visit (HOSPITAL_COMMUNITY): Payer: Self-pay

## 2023-04-16 NOTE — Lactation Note (Signed)
This note was copied from a baby's chart.  NICU Lactation Consultation Note  Patient Name: Girl Teresina Koen ZOXWR'U Date: 04/16/2023 Age:22 years  Reason for consult: Follow-up assessment; Breastfeeding assistance; NICU baby; Late-preterm 34-36.6wks; Primapara; 1st time breastfeeding  SUBJECTIVE  LC in to assist/assess with the 12 noon feeding at the breast.   Mom pre-pumped 10 mins.  Baby sleepy, but sucking on a pacifier.  LC took pacifier out of her mouth and changed her diaper to wake "Kalliana" up.  Removed baby's onesie and placed her STS in football hold on left breast.    Attempted to help baby latch without a nipple shield first.  Mom hand expressed a drop and baby teased with nipple to top lip, [name] opened her mouth very wide and assisted to bring her quickly onto breast, but she was unable to sustain a deep latch.    LC felt 16 mm NS a little small, switched to a 20 mm NS and instructed Mom on how to properly place to draw nipple into shield.   Baby started hiccupping and squirming some.  Baby latched easily, but wasn't sucking in rhythmic pattern. Education on breast compression during sucking bursts and how it can help with milk transfer.  Baby settled down, stopped hiccupping and stayed latched for 15 mins, a couple sucks and one probable swallow heard.  Milk in nipple shield when baby taken off and placed STS on Mom's chest to sleep while gavage feeding was going.  LC asked Mom to have her RN call LC if baby is more interested in feeding at next feeding.    OBJECTIVE Infant data: No data recorded Infant feeding assessment Scale for Readiness: 2 Scale for Quality: 3   Maternal data: G2P0111  Vaginal, Spontaneous Pumping frequency: Every 3 hrs Pumped volume: 120 mL Flange Size: 21  WIC Program: Yes WIC Referral Sent?: Yes What county?: [place] (Mom in the process of switching to [facility]) Pump:  (Personal pump bought on [employer], unsure of brand  name)  ASSESSMENT Infant: LATCH Documentation Latch: 1 Audible Swallowing: 0 (one swallow identified) Type of Nipple: 2 Comfort (Breast/Nipple): 2 Hold (Positioning): 1 LATCH Score: 6  Feeding Status: Scheduled 9-12-3-6  Maternal: Milk volume: Normal  INTERVENTIONS/PLAN Interventions: Interventions: Breast feeding basics reviewed; Assisted with latch; Skin to skin; Breast massage; Hand express; Pre-pump if needed; Breast compression; Adjust position; Support pillows; Expressed milk; DEBP; Education Tools: Pump; Flanges; Nipple Shields Nipple shield size: 20  Plan: Consult Status: NICU follow-up NICU Follow-up type: Assist with IDF-1 (Mother to pre-pump before breastfeeding)   [name] 04/16/2023, 12:19 PM

## 2023-04-18 ENCOUNTER — Telehealth (HOSPITAL_COMMUNITY): Payer: Self-pay | Admitting: *Deleted

## 2023-04-18 ENCOUNTER — Ambulatory Visit (HOSPITAL_COMMUNITY): Payer: Self-pay

## 2023-04-18 NOTE — Lactation Note (Signed)
This note was copied from a baby's chart.  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  Mom had baby in her lap, sucking on a pacifier with gavage feeding running (it just was started).  LC stopped it temporarily with RN permission.  Mom pre-pumped 10 mins.  Baby positioned with guidance on use of pillow support a height of breast.  Assisted Mom to sandwich her breast in a U shape to facilitate a deeper latch.  After a couple attempts without the NS, LC initiated the 20 mm NS (showing Mom how to properly apply by inverting half way the shield and stretching it over her nipple to draw the nipple into the shield).  Mom appreciative as she said that she hadn't been doing it correctly.  Teaching done on this important step.  Baby "Linda Cross" latched, but not deeply initially.  Baby sucking with non-nutritive sucks for about 5-8 minutes.  When baby relaxed, showed Mom how to hold baby in closer with chin deeper into breast, neck straight and LC tugged gently on baby's chin.  Mouth relaxed, opened wider and lip flanged.  Mom could see the difference.  Baby started with nutritive sucking using deep jaw extensions slow and steady.  Taught Mom to use alternate breast compression, keeping her hand stable on breast but gently increasing the pressure.  Mom could see how this helped baby swallow more.  Baby showed no sign of stress during feeding.  Baby fed consistently without any stimulation needed to continue sucking, for 8 mins.  Baby came off on her own and milk filled the NS.  Mom placed baby STS on her chest and she was contented acting.  RN to start gavage.  With IDF, baby would be getting 2/3  gavage.  Mom encouraged to finish her pumping after each feeding. LC recommended pumping long enough until the milk starts slowing down (pumping off the let down), maybe 3-5 mins.    OBJECTIVE Infant data: No data recorded Infant feeding assessment Scale for Readiness: 3   Maternal data: G2P0111  Vaginal, Spontaneous Pumping frequency: encouraged to pump for 3-5 mins prior to latching baby, and then finish pumping after breastfeeding Flange Size: 21  WIC Program: Yes WIC Referral Sent?: Yes (Faxed a new referral to [facility]) What county?: [place] (Mom in the process of switching to [facility]) Pump:  (Personal pump bought on [employer], unsure of brand name)  ASSESSMENT Infant: Latch score of 9 for 8 minutes   Maternal: Milk volume: Normal  INTERVENTIONS/PLAN Interventions: Interventions: Breast feeding basics reviewed; Assisted with latch; Skin to skin; Breast massage; Pre-pump if needed; Breast compression; Adjust position; Support pillows; Position options; DEBP; Education Tools: Pump; Flanges; Nipple Dorris Carnes; Hands-free pumping top Nipple shield size: 20  Plan: Consult Status: NICU follow-up NICU Follow-up type: Assist with IDF-1 (Mother to pre-pump before breastfeeding)   [name] 04/18/2023, 8:16 AM

## 2023-04-18 NOTE — Telephone Encounter (Signed)
Attempted Hospital Discharge Follow-Up Call.  Left voice mail requesting that patient return RN's phone call if patient has any concerns or questions.  

## 2023-04-24 ENCOUNTER — Ambulatory Visit (HOSPITAL_COMMUNITY): Payer: Self-pay

## 2023-04-24 NOTE — Lactation Note (Signed)
This note was copied from a baby's chart.  NICU Lactation Consultation Note  Patient Name: Linda Cross [id number] Date: 04/24/2023 Age:21 wk.o.  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 She skips one of her pumping sessions at night but without going + 6 hours without pumping, she power pumps the next morning and has an adequate supply. Linda Cross is take baby "Linda Cross" home today. Her plan is to continue pumping & bottle feeding and occasionally taking her to breast; she voiced the NS # 16 works better than the # 20. Reviewed discharge education and the importance of continuing pumping after feedings at the breast to protect her supply. She politely declined a referral for LC OP but has their contact info in case she needs their services. All questions and concerns answered, family to contact [facility] services PRN.  OBJECTIVE Infant data: Mother's Current Feeding Choice: Breast Milk  Infant feeding assessment Scale for Readiness: 1 Scale for Quality: 2   Maternal data: G2P0111  Vaginal, Spontaneous Pumping frequency: 6-8 times/24 hours Pumped volume: 90 mL Flange Size: 21  WIC Program: Yes WIC Referral Sent?: Yes (Faxed a new referral to [facility]) What county?: [place] (Mom in the process of switching to [facility]) Pump:  (Personal pump bought on [employer], unsure of brand name)  ASSESSMENT Infant: Feeding Status: Ad lib  Maternal: Milk volume: Normal  INTERVENTIONS/PLAN Interventions: Interventions: Breast feeding basics reviewed; DEBP; Education Discharge Education: Outpatient recommendation Tools: Pump; Flanges Pump Education: Setup, frequency, and cleaning;  Milk Storage  Plan: Consult Status: Complete   Mikaeel Petrow S Marvena Tally 04/24/2023, 12:50 PM

## 2023-05-24 ENCOUNTER — Ambulatory Visit: Payer: Medicaid Other | Admitting: Obstetrics

## 2023-11-16 ENCOUNTER — Emergency Department: Payer: Medicaid Other

## 2023-11-16 ENCOUNTER — Other Ambulatory Visit: Payer: Self-pay

## 2023-11-16 ENCOUNTER — Emergency Department
Admission: EM | Admit: 2023-11-16 | Discharge: 2023-11-16 | Disposition: A | Discharge status: Home / Self Care | Payer: Medicaid Other | Attending: Emergency Medicine | Admitting: Emergency Medicine

## 2023-11-16 DIAGNOSIS — Z20822 Contact with and (suspected) exposure to covid-19: Secondary | ICD-10-CM | POA: Insufficient documentation

## 2023-11-16 DIAGNOSIS — R059 Cough, unspecified: Secondary | ICD-10-CM | POA: Diagnosis present

## 2023-11-16 DIAGNOSIS — B349 Viral infection, unspecified: Secondary | ICD-10-CM | POA: Diagnosis not present

## 2023-11-16 DIAGNOSIS — E860 Dehydration: Secondary | ICD-10-CM | POA: Diagnosis not present

## 2023-11-16 LAB — CBC WITH DIFFERENTIAL/PLATELET
Abs Immature Granulocytes: 0.02 10*3/uL (ref 0.00–0.07)
Basophils Absolute: 0 10*3/uL (ref 0.0–0.1)
Basophils Relative: 0 %
Eosinophils Absolute: 0 10*3/uL (ref 0.0–0.5)
Eosinophils Relative: 1 %
HCT: 42 % (ref 36.0–46.0)
Hemoglobin: 12.9 g/dL (ref 12.0–15.0)
Immature Granulocytes: 0 %
Lymphocytes Relative: 22 %
Lymphs Abs: 1.3 10*3/uL (ref 0.7–4.0)
MCH: 24.7 pg — ABNORMAL LOW (ref 26.0–34.0)
MCHC: 30.7 g/dL (ref 30.0–36.0)
MCV: 80.3 fL (ref 80.0–100.0)
Monocytes Absolute: 0.7 10*3/uL (ref 0.1–1.0)
Monocytes Relative: 11 %
Neutro Abs: 3.9 10*3/uL (ref 1.7–7.7)
Neutrophils Relative %: 66 %
Platelets: 128 10*3/uL — ABNORMAL LOW (ref 150–400)
RBC: 5.23 MIL/uL — ABNORMAL HIGH (ref 3.87–5.11)
RDW: 15.6 % — ABNORMAL HIGH (ref 11.5–15.5)
WBC: 5.9 10*3/uL (ref 4.0–10.5)
nRBC: 0 % (ref 0.0–0.2)

## 2023-11-16 LAB — COMPREHENSIVE METABOLIC PANEL
ALT: 16 U/L (ref 0–44)
AST: 16 U/L (ref 15–41)
Albumin: 3.9 g/dL (ref 3.5–5.0)
Alkaline Phosphatase: 85 U/L (ref 38–126)
Anion gap: 14 (ref 5–15)
BUN: 9 mg/dL (ref 6–20)
CO2: 19 mmol/L — ABNORMAL LOW (ref 22–32)
Calcium: 8.9 mg/dL (ref 8.9–10.3)
Chloride: 102 mmol/L (ref 98–111)
Creatinine, Ser: 0.53 mg/dL (ref 0.44–1.00)
GFR, Estimated: >60 mL/min (ref >60)
Glucose, Bld: 76 mg/dL (ref 70–99)
Potassium: 3.8 mmol/L (ref 3.5–5.1)
Sodium: 135 mmol/L (ref 135–145)
Total Bilirubin: 0.6 mg/dL (ref <1.2)
Total Protein: 7.9 g/dL (ref 6.5–8.1)

## 2023-11-16 LAB — GROUP A STREP BY PCR: Group A Strep by PCR: NOT DETECTED

## 2023-11-16 LAB — RESP PANEL BY RT-PCR (RSV, FLU A&B, COVID)  RVPGX2
Influenza A by PCR: NEGATIVE
Influenza B by PCR: NEGATIVE
Resp Syncytial Virus by PCR: NEGATIVE
SARS Coronavirus 2 by RT PCR: NEGATIVE

## 2023-11-16 MED ORDER — ONDANSETRON HCL 4 MG PO TABS: 4.0000 mg | ORAL_TABLET | Freq: Four times a day (QID) | ORAL | 0 refills | Status: AC | PRN

## 2023-11-16 MED ORDER — ACETAMINOPHEN 325 MG PO TABS
650.0000 mg | ORAL_TABLET | Freq: Once | ORAL | Status: AC
  Administered 2023-11-16: 650 mg via ORAL
  Filled 2023-11-16: qty 2

## 2023-11-16 MED ORDER — SODIUM CHLORIDE 0.9 % IV BOLUS
1000.0000 mL | Freq: Once | INTRAVENOUS | Status: AC
  Administered 2023-11-16: 1000 mL via INTRAVENOUS

## 2023-11-16 MED ORDER — AMOXICILLIN 500 MG PO CAPS: 1000.0000 mg | ORAL_CAPSULE | Freq: Three times a day (TID) | ORAL | 0 refills | Status: AC

## 2023-11-16 MED ORDER — ONDANSETRON HCL 4 MG/2ML IJ SOLN
4.0000 mg | Freq: Once | INTRAMUSCULAR | Status: AC
  Administered 2023-11-16: 4 mg via INTRAVENOUS
  Filled 2023-11-16: qty 2

## 2023-11-16 MED ORDER — KETOROLAC TROMETHAMINE 15 MG/ML IJ SOLN
15.0000 mg | Freq: Once | INTRAMUSCULAR | Status: AC
  Administered 2023-11-16: 15 mg via INTRAVENOUS
  Filled 2023-11-16: qty 1

## 2023-11-16 NOTE — ED Triage Notes (Signed)
Patient reports non productive cough, body aches and sore throat since Sunday. No known sick exposures.

## 2023-11-16 NOTE — Discharge Instructions (Addendum)
You were seen in the emergency department today for evaluation of your fever, cough, body aches.  Your blood work and x-Rafe Mackowski were overall reassuring.  I suspect you likely have a viral illness.  I sent a prescription for nausea medicine to your pharmacy that you can take as needed. Return to the ER for new or worsening symptoms.

## 2023-11-16 NOTE — ED Notes (Signed)
Update vitals   Trinna Post, MD 11/16/23 [phone]

## 2023-11-16 NOTE — ED Notes (Signed)
See triage note  Presents with body aches,fever,cough and sore throat  States she developed these sx's on Sunday

## 2023-11-16 NOTE — ED Provider Notes (Addendum)
[facility] Provider Note    Event Date/Time   First MD Initiated Contact with Patient 11/16/23 1755     (approximate)   History   Cough   HPI  Linda Cross is a 22 year old female presenting to the emergency department for evaluation of cough and bodyaches.  Symptoms began on Sunday, having gotten progressively worse.  Has had fevers.  No known sick contacts.  Reports mild associated shortness of breath.  Decreased appetite tolerating some p.o. intake.  Reports nausea without vomiting.    Physical Exam   Triage Vital Signs: ED Triage Vitals  Encounter Vitals Group     BP 11/16/23 1717 (!) 152/94     Systolic BP Percentile --      Diastolic BP Percentile --      Pulse Rate 11/16/23 1717 (!) 142     Resp 11/16/23 1717 18     Temp 11/16/23 1717 (!) 102 F (38.9 C)     Temp Source 11/16/23 1717 Oral     SpO2 11/16/23 1717 100 %     Weight 11/16/23 1754 138 lb 14.2 oz (63 kg)     Height 11/16/23 1754 5\' 5"  (1.651 m)     Head Circumference --      Peak Flow --      Pain Score 11/16/23 1719 6     Pain Loc --      Pain Education --      Exclude from Growth Chart --     Most recent vital signs: Vitals:   11/16/23 1930 11/16/23 2045  BP: 126/81 114/73  Pulse: (!) 106 98  Resp: 15 16  Temp: 99.1 F (37.3 C)   SpO2: 100% 99%     General: Awake, interactive  HEENT: Mild erythema of the posterior oropharynx without tonsillar swelling or exudates CV:  Tachycardic with regular rhythm, normal peripheral perfusion Resp:  Unlabored respirations, lungs clear to auscultation Abd:  Nondistended, soft, nontender Neuro:  Symmetric facial movement, fluid speech   ED Results / Procedures / Treatments   Labs (all labs ordered are listed, but only abnormal results are displayed) Labs Reviewed  CBC WITH DIFFERENTIAL/PLATELET - Abnormal; Notable for the following components:      Result Value   RBC 5.23 (*)    MCH 24.7 (*)    RDW 15.6 (*)     Platelets 128 (*)    All other components within normal limits  COMPREHENSIVE METABOLIC PANEL - Abnormal; Notable for the following components:   CO2 19 (*)    All other components within normal limits  GROUP A STREP BY PCR  RESP PANEL BY RT-PCR (RSV, FLU A&B, COVID)  [id number]     EKG EKG independently reviewed interpreted by myself (ER attending) demonstrates:    RADIOLOGY Imaging independently reviewed and interpreted by myself demonstrates:  CXR without obvious focal consolidation on my review, formal radiology read pending  PROCEDURES:  Critical Care performed: No  Procedures   MEDICATIONS ORDERED IN ED: Medications  acetaminophen (TYLENOL) tablet 650 mg (650 mg Oral Given 11/16/23 1720)  ondansetron (ZOFRAN) injection 4 mg (4 mg Intravenous Given 11/16/23 1858)  sodium chloride 0.9 % bolus 1,000 mL (0 mLs Intravenous Stopped 11/16/23 2045)  ketorolac (TORADOL) 15 MG/ML injection 15 mg (15 mg Intravenous Given 11/16/23 1858)     IMPRESSION / MDM / ASSESSMENT AND PLAN / ED COURSE  I reviewed the triage vital signs and the nursing notes.  Differential diagnosis includes, but is  not limited to, viral illness, pneumonia, anemia, electrolyte abnormality, strep pharyngitis  Patient's presentation is most consistent with acute presentation with potential threat to life or bodily function.  22 year old female presenting with cough and bodyaches.  Febrile and tachycardic on presentation.  Significant improvement in tachycardia as fever resolved.  Suspect likely viral process, clinically patient does not appear septic.  Labs reassuring including normal white blood cell count.  Negative strep and viral testing.  X-Verl Whitmore without obvious consolidation on my review, formal radiology read pending.  Patient treated symptomatically with IV fluids, Toradol, Zofran.  Feels much improved on reevaluation and is comfortable with discharge home.  Strict return precautions provided.  Patient  discharged stable condition.  10:02 PM formal radiology read did note findings concerning for pneumonia in the lingula.  Patient not meeting sepsis criteria at the time of discharge, do think she is stable to trial outpatient treatment.  Voicemail left for patient with permission from patient prior to discharge.  Prescription for amoxicillin sent to her pharmacy.     FINAL CLINICAL IMPRESSION(S) / ED DIAGNOSES   Final diagnoses:  Nonspecific syndrome suggestive of viral illness  Dehydration     Rx / DC Orders   ED Discharge Orders          Ordered    ondansetron (ZOFRAN) 4 MG tablet  Every 6 hours PRN        11/16/23 2017    amoxicillin (AMOXIL) 500 MG capsule  3 times daily        11/16/23 2201             Note:  This document was prepared using Dragon voice recognition software and may include unintentional dictation errors.   Trinna Post, MD 11/16/23 2952    Trinna Post, MD 11/16/23 2202
